# Patient Record
Sex: Female | Born: 1995 | Race: White | Hispanic: No | Marital: Single | Smoking: Current every day smoker
Health system: Southern US, Community
[De-identification: ages and names within clinical notes are randomized; demographics above are authoritative.]

## PROBLEM LIST (undated history)

## (undated) DIAGNOSIS — O24419 Gestational diabetes mellitus in pregnancy, unspecified control: Secondary | ICD-10-CM

## (undated) DIAGNOSIS — K219 Gastro-esophageal reflux disease without esophagitis: Secondary | ICD-10-CM

## (undated) HISTORY — PX: NO PAST SURGERIES: SHX2092

---

## 2017-07-31 ENCOUNTER — Encounter (HOSPITAL_COMMUNITY): Payer: Self-pay

## 2017-07-31 ENCOUNTER — Other Ambulatory Visit: Payer: Self-pay

## 2017-07-31 ENCOUNTER — Emergency Department (HOSPITAL_COMMUNITY)
Admission: EM | Admit: 2017-07-31 | Discharge: 2017-07-31 | Disposition: A | Discharge status: Home / Self Care | Payer: Self-pay | Attending: Emergency Medicine | Admitting: Emergency Medicine

## 2017-07-31 DIAGNOSIS — R1110 Vomiting, unspecified: Secondary | ICD-10-CM | POA: Insufficient documentation

## 2017-07-31 DIAGNOSIS — Z5321 Procedure and treatment not carried out due to patient leaving prior to being seen by health care provider: Secondary | ICD-10-CM | POA: Insufficient documentation

## 2017-07-31 LAB — CBC
HCT: 43.7 % (ref 36.0–46.0)
HEMOGLOBIN: 15.5 g/dL — AB (ref 12.0–15.0)
MCH: 30.9 pg (ref 26.0–34.0)
MCHC: 35.5 g/dL (ref 30.0–36.0)
MCV: 87.1 fL (ref 78.0–100.0)
PLATELETS: 347 10*3/uL (ref 150–400)
RBC: 5.02 MIL/uL (ref 3.87–5.11)
RDW: 12.4 % (ref 11.5–15.5)
WBC: 17.6 10*3/uL — ABNORMAL HIGH (ref 4.0–10.5)

## 2017-07-31 LAB — COMPREHENSIVE METABOLIC PANEL
ALBUMIN: 4.5 g/dL (ref 3.5–5.0)
ALT: 14 U/L (ref 14–54)
AST: 28 U/L (ref 15–41)
Alkaline Phosphatase: 62 U/L (ref 38–126)
Anion gap: 15 (ref 5–15)
BUN: 7 mg/dL (ref 6–20)
CHLORIDE: 103 mmol/L (ref 101–111)
CO2: 19 mmol/L — ABNORMAL LOW (ref 22–32)
CREATININE: 0.85 mg/dL (ref 0.44–1.00)
Calcium: 9.8 mg/dL (ref 8.9–10.3)
GFR calc non Af Amer: >60 mL/min (ref >60)
Glucose, Bld: 120 mg/dL — ABNORMAL HIGH (ref 65–99)
Potassium: 3.3 mmol/L — ABNORMAL LOW (ref 3.5–5.1)
SODIUM: 137 mmol/L (ref 135–145)
Total Bilirubin: 1 mg/dL (ref 0.3–1.2)
Total Protein: 8.2 g/dL — ABNORMAL HIGH (ref 6.5–8.1)

## 2017-07-31 LAB — URINALYSIS, ROUTINE W REFLEX MICROSCOPIC
Bacteria, UA: NONE SEEN
Bilirubin Urine: NEGATIVE
Glucose, UA: NEGATIVE mg/dL
Ketones, ur: NEGATIVE mg/dL
Leukocytes, UA: NEGATIVE
Nitrite: NEGATIVE
PROTEIN: NEGATIVE mg/dL
Specific Gravity, Urine: 1.002 — ABNORMAL LOW (ref 1.005–1.030)
pH: 9 — ABNORMAL HIGH (ref 5.0–8.0)

## 2017-07-31 LAB — I-STAT BETA HCG BLOOD, ED (MC, WL, AP ONLY)

## 2017-07-31 LAB — LIPASE, BLOOD: LIPASE: 34 U/L (ref 11–51)

## 2017-07-31 MED ORDER — ONDANSETRON 4 MG PO TBDP
4.0000 mg | ORAL_TABLET | Freq: Once | ORAL | Status: AC | PRN
  Administered 2017-07-31: 4 mg via ORAL
  Filled 2017-07-31: qty 1

## 2017-07-31 NOTE — ED Triage Notes (Signed)
Per Pt, Pt is coming from home with complaints of vomiting since last night. Now reports some CP and SOB. Denies any diarrhea or abdominal pain.

## 2017-07-31 NOTE — ED Notes (Signed)
No answer x3

## 2017-07-31 NOTE — ED Notes (Signed)
Lab work, radiology results and vital signs reviewed, no critical results at this time, no change in acuity indicated.  

## 2017-07-31 NOTE — ED Notes (Signed)
Called patient to come back to room. No answer.

## 2017-10-18 ENCOUNTER — Emergency Department (HOSPITAL_COMMUNITY)
Admission: EM | Admit: 2017-10-18 | Discharge: 2017-10-18 | Disposition: A | Discharge status: Home / Self Care | Payer: Self-pay | Attending: Emergency Medicine | Admitting: Emergency Medicine

## 2017-10-18 ENCOUNTER — Encounter (HOSPITAL_COMMUNITY): Payer: Self-pay | Admitting: Emergency Medicine

## 2017-10-18 ENCOUNTER — Other Ambulatory Visit: Payer: Self-pay

## 2017-10-18 DIAGNOSIS — R112 Nausea with vomiting, unspecified: Secondary | ICD-10-CM | POA: Insufficient documentation

## 2017-10-18 DIAGNOSIS — F17200 Nicotine dependence, unspecified, uncomplicated: Secondary | ICD-10-CM | POA: Insufficient documentation

## 2017-10-18 LAB — URINALYSIS, ROUTINE W REFLEX MICROSCOPIC
Bilirubin Urine: NEGATIVE
GLUCOSE, UA: NEGATIVE mg/dL
Hgb urine dipstick: NEGATIVE
KETONES UR: NEGATIVE mg/dL
LEUKOCYTES UA: NEGATIVE
NITRITE: NEGATIVE
PH: 8 (ref 5.0–8.0)
Protein, ur: NEGATIVE mg/dL
SPECIFIC GRAVITY, URINE: 1.013 (ref 1.005–1.030)

## 2017-10-18 LAB — CBC
HEMATOCRIT: 44.5 % (ref 36.0–46.0)
HEMOGLOBIN: 15.5 g/dL — AB (ref 12.0–15.0)
MCH: 30 pg (ref 26.0–34.0)
MCHC: 34.8 g/dL (ref 30.0–36.0)
MCV: 86.2 fL (ref 78.0–100.0)
Platelets: 314 10*3/uL (ref 150–400)
RBC: 5.16 MIL/uL — ABNORMAL HIGH (ref 3.87–5.11)
RDW: 12.5 % (ref 11.5–15.5)
WBC: 21.2 10*3/uL — AB (ref 4.0–10.5)

## 2017-10-18 LAB — LIPASE, BLOOD: LIPASE: 32 U/L (ref 11–51)

## 2017-10-18 LAB — COMPREHENSIVE METABOLIC PANEL
ALBUMIN: 4.8 g/dL (ref 3.5–5.0)
ALT: 16 U/L (ref 14–54)
ANION GAP: 14 (ref 5–15)
AST: 23 U/L (ref 15–41)
Alkaline Phosphatase: 68 U/L (ref 38–126)
BUN: 16 mg/dL (ref 6–20)
CHLORIDE: 104 mmol/L (ref 101–111)
CO2: 18 mmol/L — ABNORMAL LOW (ref 22–32)
Calcium: 9.7 mg/dL (ref 8.9–10.3)
Creatinine, Ser: 0.85 mg/dL (ref 0.44–1.00)
GFR calc Af Amer: >60 mL/min (ref >60)
GFR calc non Af Amer: >60 mL/min (ref >60)
GLUCOSE: 121 mg/dL — AB (ref 65–99)
POTASSIUM: 3.6 mmol/L (ref 3.5–5.1)
SODIUM: 136 mmol/L (ref 135–145)
TOTAL PROTEIN: 8.5 g/dL — AB (ref 6.5–8.1)
Total Bilirubin: 0.8 mg/dL (ref 0.3–1.2)

## 2017-10-18 LAB — I-STAT BETA HCG BLOOD, ED (MC, WL, AP ONLY): I-stat hCG, quantitative: <5 m[IU]/mL (ref <5)

## 2017-10-18 MED ORDER — METOCLOPRAMIDE HCL 5 MG/ML IJ SOLN
10.0000 mg | Freq: Once | INTRAMUSCULAR | Status: AC
  Administered 2017-10-18: 10 mg via INTRAVENOUS
  Filled 2017-10-18: qty 2

## 2017-10-18 MED ORDER — METOCLOPRAMIDE HCL 10 MG PO TABS: 10.0000 mg | ORAL_TABLET | Freq: Three times a day (TID) | ORAL | 0 refills | Status: DC | PRN

## 2017-10-18 MED ORDER — PANTOPRAZOLE SODIUM 20 MG PO TBEC: 20.0000 mg | DELAYED_RELEASE_TABLET | Freq: Every day | ORAL | 0 refills | Status: DC

## 2017-10-18 MED ORDER — GI COCKTAIL ~~LOC~~
30.0000 mL | Freq: Once | ORAL | Status: AC
  Administered 2017-10-18: 30 mL via ORAL
  Filled 2017-10-18: qty 30

## 2017-10-18 MED ORDER — ONDANSETRON 4 MG PO TBDP
4.0000 mg | ORAL_TABLET | Freq: Once | ORAL | Status: AC | PRN
  Administered 2017-10-18: 4 mg via ORAL
  Filled 2017-10-18: qty 1

## 2017-10-18 MED ORDER — PANTOPRAZOLE SODIUM 40 MG PO TBEC
40.0000 mg | DELAYED_RELEASE_TABLET | Freq: Once | ORAL | Status: AC
  Administered 2017-10-18: 40 mg via ORAL
  Filled 2017-10-18: qty 1

## 2017-10-18 NOTE — ED Notes (Signed)
ED Provider at bedside. 

## 2017-10-18 NOTE — ED Provider Notes (Signed)
[name] EMERGENCY DEPARTMENT Provider Note   CSN: [id number] Arrival date & time: 10/18/17  1435     History   Chief Complaint Chief Complaint  Patient presents with  . Emesis    HPI Breanna Nguyen is a 22 y.o. female presenting for evaluation of nausea vomiting.  Patient states that when she woke up this morning, she started vomiting.  Every time she lays flat, she starts to vomit again.  She reports improvement with Zofran given while waiting.  She denies fevers, chills, chest pain, shortness of breath, cough, abdominal pain, urinary symptoms, abnormal bowel movements.  She reports multiple sick contacts with "a 24 hr stomach bug."  She has a history of reflux symptoms, but is not on treatment daily.  Ate hamburgers last night.  No recent travel.  No history of abdominal problems or surgeries.  No other medical problems, does not take medications daily.   HPI  History reviewed. No pertinent past medical history.  There are no active problems to display for this patient.   History reviewed. No pertinent surgical history.   OB History   None      Home Medications    Prior to Admission medications   Medication Sig Start Date End Date Taking? Authorizing Provider  metoCLOPramide (REGLAN) 10 MG tablet Take 1 tablet (10 mg total) by mouth every 8 (eight) hours as needed for nausea or vomiting. 10/18/17   Kaori Jumper, PA-C  pantoprazole (PROTONIX) 20 MG tablet Take 1 tablet (20 mg total) by mouth daily. 10/18/17   Lavanya Roa, PA-C    Family History No family history on file.  Social History Social History   Tobacco Use  . Smoking status: Current Some Day Smoker  . Smokeless tobacco: Never Used  Substance Use Topics  . Alcohol use: No    Frequency: Never  . Drug use: No     Allergies   Patient has no known allergies.   Review of Systems Review of Systems  Constitutional: Negative for fever.  Gastrointestinal: Positive for  nausea and vomiting. Negative for abdominal pain, constipation and diarrhea.  All other systems reviewed and are negative.    Physical Exam Updated Vital Signs BP (!) 103/59   Pulse 68   Temp 97.9 F (36.6 C) (Oral)   Resp 18   LMP 10/13/2017   SpO2 100%   Physical Exam  Constitutional: She is oriented to person, place, and time. She appears well-developed and well-nourished. No distress.  Pt resting comfortably in bed in no apparent distress.  HENT:  Head: Normocephalic and atraumatic.  Mouth/Throat: Uvula is midline, oropharynx is clear and moist and mucous membranes are normal. Mucous membranes are not dry.  Eyes: Pupils are equal, round, and reactive to light. Conjunctivae and EOM are normal.  Neck: Normal range of motion. Neck supple.  Cardiovascular: Normal rate, regular rhythm and intact distal pulses.  Pulmonary/Chest: Effort normal and breath sounds normal. No respiratory distress. She has no wheezes.  Abdominal: Soft. Bowel sounds are normal. She exhibits no distension and no mass. There is no tenderness. There is no guarding.  No tenderness palpation of the abdomen.  Soft without rigidity, guarding, or distention.  Bowel sounds normoactive x4.  Musculoskeletal: Normal range of motion.  Neurological: She is alert and oriented to person, place, and time.  Skin: Skin is warm and dry.  Psychiatric: She has a normal mood and affect.  Nursing note and vitals reviewed.    ED Treatments / Results  Labs (all labs ordered are listed, but only abnormal results are displayed) Labs Reviewed  COMPREHENSIVE METABOLIC PANEL - Abnormal; Notable for the following components:      Result Value   CO2 18 (*)    Glucose, Bld 121 (*)    Total Protein 8.5 (*)    All other components within normal limits  CBC - Abnormal; Notable for the following components:   WBC 21.2 (*)    RBC 5.16 (*)    Hemoglobin 15.5 (*)    All other components within normal limits  LIPASE, BLOOD    URINALYSIS, ROUTINE W REFLEX MICROSCOPIC  I-STAT BETA HCG BLOOD, ED ([facility], [facility], [facility] ONLY)    EKG None  Radiology No results found.  Procedures Procedures (including critical care time)  Medications Ordered in ED Medications  ondansetron (ZOFRAN-ODT) disintegrating tablet 4 mg (4 mg Oral Given 10/18/17 1523)  gi cocktail (Maalox,Lidocaine,Donnatal) (30 mLs Oral Given 10/18/17 1643)  pantoprazole (PROTONIX) EC tablet 40 mg (40 mg Oral Given 10/18/17 1643)  metoCLOPramide (REGLAN) injection 10 mg (10 mg Intravenous Given 10/18/17 1815)     Initial Impression / Assessment and Plan / ED Course  I have reviewed the triage vital signs and the nursing notes.  Pertinent labs & imaging results that were available during my care of the patient were reviewed by me and considered in my medical decision making (see chart for details).     Pt presenting for evaluation of nausea and vomiting.  Physical examination, she is afebrile not tachycardic.  She does not appear dehydrated.  She does not appear toxic.  Abdominal exam reassuring without tenderness, rigidity, or guarding.  Vomiting precipitated by lying flat, and patient with history of GERD.  Question gastritis versus GERD.  Will trial GI cocktail and Protonix.  Labs show elevated white count at 21.  Otherwise reassuring, no change in creatinine, kidney, liver, pancreas function.  Does not appear dehydrated.  Discussed findings with patient.  Discussed option of scan versus symptomatic treatment, patient elects to try symptomatic treatment.  Pt reports increased vomiting when taking the medication.  Will give Reglan IV and reassess. Urine without infection.  On reassessment, patient reports nausea is resolved.  No further vomiting.  Tolerating liquids.  Discussed findings with patient.  Discussed treatment with Reglan as needed and Protonix daily.  Follow-up with gi for further evaluation as needed.  At this time, patient present for discharge.   Return precautions given, including signs for appendicitis.  Patient states she understands and agrees to plan.  Final Clinical Impressions(s) / ED Diagnoses   Final diagnoses:  Intractable vomiting with nausea, unspecified vomiting type    ED Discharge Orders        Ordered    metoCLOPramide (REGLAN) 10 MG tablet  Every 8 hours PRN     10/18/17 1928    pantoprazole (PROTONIX) 20 MG tablet  Daily     10/18/17 1928       Alveria ApleyCaccavale, Kysen Wetherington, PA-C 10/18/17 2245    Mancel BaleWentz, Elliott, MD 10/18/17 2250

## 2017-10-18 NOTE — Discharge Instructions (Signed)
Take Protonix once a day for the next 2 weeks. Use Reglan as needed for nausea or vomiting. Be careful with your food choices, try to avoid greasy, fatty, acidic foods.  Eat small, more frequent meals of the day.  Make sure you stay upright for at least 60 minutes after eating. Follow-up with stomach doctor for further evaluation of your symptoms if they persist. Return to the emergency room if you develop high fevers, severe abdominal pain (especially in the right lower abdomen), or any new or concerning symptoms.

## 2017-10-18 NOTE — ED Triage Notes (Signed)
Pt c/o vomiting that started this morning. Denies abdominal pain/diarrhea. LMP 10/13/17

## 2017-10-29 ENCOUNTER — Emergency Department (HOSPITAL_COMMUNITY): Payer: Medicaid Other

## 2017-10-29 ENCOUNTER — Emergency Department (HOSPITAL_COMMUNITY)
Admission: EM | Admit: 2017-10-29 | Discharge: 2017-10-29 | Disposition: A | Discharge status: Home / Self Care | Payer: Medicaid Other | Attending: Emergency Medicine | Admitting: Emergency Medicine

## 2017-10-29 ENCOUNTER — Encounter (HOSPITAL_COMMUNITY): Payer: Self-pay | Admitting: Emergency Medicine

## 2017-10-29 DIAGNOSIS — F17200 Nicotine dependence, unspecified, uncomplicated: Secondary | ICD-10-CM | POA: Insufficient documentation

## 2017-10-29 DIAGNOSIS — K2900 Acute gastritis without bleeding: Secondary | ICD-10-CM | POA: Insufficient documentation

## 2017-10-29 LAB — COMPREHENSIVE METABOLIC PANEL
ALBUMIN: 4.3 g/dL (ref 3.5–5.0)
ALK PHOS: 61 U/L (ref 38–126)
ALT: 13 U/L — ABNORMAL LOW (ref 14–54)
ANION GAP: 12 (ref 5–15)
AST: 19 U/L (ref 15–41)
BUN: 6 mg/dL (ref 6–20)
CALCIUM: 9.6 mg/dL (ref 8.9–10.3)
CO2: 22 mmol/L (ref 22–32)
Chloride: 101 mmol/L (ref 101–111)
Creatinine, Ser: 0.84 mg/dL (ref 0.44–1.00)
GFR calc Af Amer: >60 mL/min (ref >60)
GFR calc non Af Amer: >60 mL/min (ref >60)
GLUCOSE: 108 mg/dL — AB (ref 65–99)
POTASSIUM: 3.5 mmol/L (ref 3.5–5.1)
SODIUM: 135 mmol/L (ref 135–145)
Total Bilirubin: 1.2 mg/dL (ref 0.3–1.2)
Total Protein: 8.1 g/dL (ref 6.5–8.1)

## 2017-10-29 LAB — LIPASE, BLOOD: Lipase: 33 U/L (ref 11–51)

## 2017-10-29 LAB — I-STAT TROPONIN, ED: Troponin i, poc: 0 ng/mL (ref 0.00–0.08)

## 2017-10-29 LAB — CBC
HEMATOCRIT: 42.5 % (ref 36.0–46.0)
HEMOGLOBIN: 14.4 g/dL (ref 12.0–15.0)
MCH: 29.4 pg (ref 26.0–34.0)
MCHC: 33.9 g/dL (ref 30.0–36.0)
MCV: 86.9 fL (ref 78.0–100.0)
Platelets: 350 10*3/uL (ref 150–400)
RBC: 4.89 MIL/uL (ref 3.87–5.11)
RDW: 12.8 % (ref 11.5–15.5)
WBC: 16.8 10*3/uL — ABNORMAL HIGH (ref 4.0–10.5)

## 2017-10-29 LAB — I-STAT BETA HCG BLOOD, ED (MC, WL, AP ONLY): I-stat hCG, quantitative: <5 m[IU]/mL (ref <5)

## 2017-10-29 MED ORDER — RANITIDINE HCL 150 MG PO CAPS: 150.0000 mg | ORAL_CAPSULE | Freq: Every day | ORAL | 0 refills | Status: DC

## 2017-10-29 MED ORDER — GI COCKTAIL ~~LOC~~
30.0000 mL | Freq: Once | ORAL | Status: AC
  Administered 2017-10-29: 30 mL via ORAL
  Filled 2017-10-29: qty 30

## 2017-10-29 MED ORDER — ONDANSETRON 4 MG PO TBDP
4.0000 mg | ORAL_TABLET | Freq: Once | ORAL | Status: AC
  Administered 2017-10-29: 4 mg via ORAL
  Filled 2017-10-29: qty 1

## 2017-10-29 NOTE — ED Provider Notes (Signed)
[facility] EMERGENCY DEPARTMENT Provider Note   CSN: [id number] Arrival date & time: 10/29/17  1350     History   Chief Complaint Chief Complaint  Patient presents with  . Chest Pain    HPI Ann MakiKarissa Dreese is a 22 y.o. female.  HPI   22 year old female presents today with complaints of nausea vomiting.  Patient reports last night after having dinner she had yellow non-bloody emesis.  Patient notes she has been unable to tolerate p.o. since.  She denies any fever, abdominal pain, urinary or bowel changes.  She denies any dark or tarry stools.  Patient also reports last night she developed midline chest pain worse with palpation, worse with movement and inspiration.  Patient notes using Zofran last night but vomited.  Patient was seen in the emergency room for similar symptoms on 10/18/2017 approximately 11 days ago.   History reviewed. No pertinent past medical history.  There are no active problems to display for this patient.   History reviewed. No pertinent surgical history.   OB History   None      Home Medications    Prior to Admission medications   Medication Sig Start Date End Date Taking? Authorizing Provider  metoCLOPramide (REGLAN) 10 MG tablet Take 1 tablet (10 mg total) by mouth every 8 (eight) hours as needed for nausea or vomiting. 10/18/17  Yes Caccavale, Sophia, PA-C  pantoprazole (PROTONIX) 20 MG tablet Take 1 tablet (20 mg total) by mouth daily. 10/18/17  Yes Caccavale, Sophia, PA-C  ranitidine (ZANTAC) 150 MG capsule Take 1 capsule (150 mg total) by mouth daily. 10/29/17   Eyvonne MechanicHedges, Thelton Graca, PA-C    Family History History reviewed. No pertinent family history.  Social History Social History   Tobacco Use  . Smoking status: Current Some Day Smoker  . Smokeless tobacco: Never Used  Substance Use Topics  . Alcohol use: No    Frequency: Never  . Drug use: No     Allergies   Patient has no known allergies.   Review of  Systems Review of Systems  All other systems reviewed and are negative.   Physical Exam Updated Vital Signs BP 121/77   Pulse (!) 52   Temp 98.8 F (37.1 C) (Oral)   Resp 16   LMP 10/13/2017   SpO2 100%   Physical Exam  Constitutional: She is oriented to person, place, and time. She appears well-developed and well-nourished.  HENT:  Head: Normocephalic and atraumatic.  Eyes: Pupils are equal, round, and reactive to light. Conjunctivae are normal. Right eye exhibits no discharge. Left eye exhibits no discharge. No scleral icterus.  Neck: Normal range of motion. No JVD present. No tracheal deviation present.  Cardiovascular: Normal rate, regular rhythm and normal heart sounds. Exam reveals no gallop and no friction rub.  No murmur heard. Pulmonary/Chest: Effort normal. No stridor.  TTP of midline chest - lung expansion normal, clear lung sounds  Abdominal: Soft. She exhibits no distension and no mass. There is no tenderness. There is no rebound and no guarding. No hernia.  Neurological: She is alert and oriented to person, place, and time. Coordination normal.  Psychiatric: She has a normal mood and affect. Her behavior is normal. Judgment and thought content normal.  Nursing note and vitals reviewed.   ED Treatments / Results  Labs (all labs ordered are listed, but only abnormal results are displayed) Labs Reviewed  COMPREHENSIVE METABOLIC PANEL - Abnormal; Notable for the following components:  Result Value   Glucose, Bld 108 (*)    ALT 13 (*)    All other components within normal limits  CBC - Abnormal; Notable for the following components:   WBC 16.8 (*)    All other components within normal limits  LIPASE, BLOOD  URINALYSIS, ROUTINE W REFLEX MICROSCOPIC  I-STAT BETA HCG BLOOD, ED ([facility], [facility], [facility] ONLY)  I-STAT TROPONIN, ED    EKG None  Radiology Dg Chest 2 View  Result Date: 10/29/2017 CLINICAL DATA:  Chest pain with cough and vomiting. EXAM: CHEST - 2 VIEW  COMPARISON:  None. FINDINGS: The heart size and mediastinal contours are within normal limits. Both lungs are clear. The visualized skeletal structures are unremarkable. IMPRESSION: No active cardiopulmonary disease. Electronically Signed   By: Elsie Stain M.D.   On: 10/29/2017 14:54    Procedures Procedures (including critical care time)  Medications Ordered in ED Medications  ondansetron (ZOFRAN-ODT) disintegrating tablet 4 mg (4 mg Oral Given 10/29/17 1811)  gi cocktail (Maalox,Lidocaine,Donnatal) (30 mLs Oral Given 10/29/17 1839)     Initial Impression / Assessment and Plan / ED Course  I have reviewed the triage vital signs and the nursing notes.  Pertinent labs & imaging results that were available during my care of the patient were reviewed by me and considered in my medical decision making (see chart for details).  Clinical Course as of Oct 29 2201  Wed Oct 29, 2017  1528 Comment 3:        [LB]    Clinical Course User Index [LB] Mervin Kung, Student-PA    Labs: Separate hCG, i-STAT troponin, lipase, CMP CBC  Imaging: DG chest 2 view  Consults:  Therapeutics: GI cocktail, Zofran  Discharge Meds:   Assessment/Plan: 22 year old female presents today with likely gastritis.  Patient having chest pain, this was reproducible but resolved completely with GI cocktail.  I have low suspicion for PE, ACS, dissection, or any other life-threatening etiology.  Patient is very well-appearing in no acute distress.  Discharged with Zantac outpatient follow-up and strict return precautions.  Both patient and her mother verbalized understanding and agreement to today's plan.    Final Clinical Impressions(s) / ED Diagnoses   Final diagnoses:  Acute gastritis without hemorrhage, unspecified gastritis type    ED Discharge Orders        Ordered    ranitidine (ZANTAC) 150 MG capsule  Daily     10/29/17 1927       Rosalio Loud 10/29/17 2204    Benjiman Core,  MD 10/31/17 0004

## 2017-10-29 NOTE — ED Triage Notes (Signed)
Patient presents to eD for assessment of 4 episodes of vomiting last night (and through out the night), with developing chest pain today.  Patient states non-productive cough.  Seen 2 weeks ago for the same

## 2017-10-29 NOTE — Discharge Instructions (Addendum)
Please read attached information. If you experience any new or worsening signs or symptoms please return to the emergency room for evaluation. Please follow-up with your primary care provider or specialist as discussed. Please use medication prescribed only as directed and discontinue taking if you have any concerning signs or symptoms.   °

## 2017-12-29 ENCOUNTER — Encounter (HOSPITAL_COMMUNITY): Payer: Self-pay | Admitting: Emergency Medicine

## 2017-12-29 ENCOUNTER — Emergency Department (HOSPITAL_COMMUNITY)
Admission: EM | Admit: 2017-12-29 | Discharge: 2017-12-29 | Disposition: A | Discharge status: Home / Self Care | Payer: Medicaid Other | Attending: Physician Assistant | Admitting: Physician Assistant

## 2017-12-29 DIAGNOSIS — Z79899 Other long term (current) drug therapy: Secondary | ICD-10-CM | POA: Insufficient documentation

## 2017-12-29 DIAGNOSIS — K2970 Gastritis, unspecified, without bleeding: Secondary | ICD-10-CM | POA: Insufficient documentation

## 2017-12-29 DIAGNOSIS — R112 Nausea with vomiting, unspecified: Secondary | ICD-10-CM

## 2017-12-29 DIAGNOSIS — F17200 Nicotine dependence, unspecified, uncomplicated: Secondary | ICD-10-CM | POA: Insufficient documentation

## 2017-12-29 LAB — CBC WITH DIFFERENTIAL/PLATELET
ABS IMMATURE GRANULOCYTES: 0.1 10*3/uL (ref 0.0–0.1)
BASOS ABS: 0 10*3/uL (ref 0.0–0.1)
BASOS PCT: 0 %
Eosinophils Absolute: 0 10*3/uL (ref 0.0–0.7)
Eosinophils Relative: 0 %
HCT: 42.2 % (ref 36.0–46.0)
HEMOGLOBIN: 14 g/dL (ref 12.0–15.0)
Immature Granulocytes: 0 %
LYMPHS PCT: 13 %
Lymphs Abs: 1.7 10*3/uL (ref 0.7–4.0)
MCH: 29.2 pg (ref 26.0–34.0)
MCHC: 33.2 g/dL (ref 30.0–36.0)
MCV: 88.1 fL (ref 78.0–100.0)
Monocytes Absolute: 0.9 10*3/uL (ref 0.1–1.0)
Monocytes Relative: 6 %
NEUTROS ABS: 10.9 10*3/uL — AB (ref 1.7–7.7)
Neutrophils Relative %: 81 %
PLATELETS: 342 10*3/uL (ref 150–400)
RBC: 4.79 MIL/uL (ref 3.87–5.11)
RDW: 12.8 % (ref 11.5–15.5)
WBC: 13.6 10*3/uL — AB (ref 4.0–10.5)

## 2017-12-29 LAB — COMPREHENSIVE METABOLIC PANEL
ALBUMIN: 4.3 g/dL (ref 3.5–5.0)
ALK PHOS: 55 U/L (ref 38–126)
ALT: 12 U/L — AB (ref 14–54)
AST: 19 U/L (ref 15–41)
Anion gap: 12 (ref 5–15)
BUN: 6 mg/dL (ref 6–20)
CALCIUM: 9.5 mg/dL (ref 8.9–10.3)
CHLORIDE: 104 mmol/L (ref 101–111)
CO2: 22 mmol/L (ref 22–32)
Creatinine, Ser: 0.96 mg/dL (ref 0.44–1.00)
GFR calc Af Amer: >60 mL/min (ref >60)
GFR calc non Af Amer: >60 mL/min (ref >60)
GLUCOSE: 109 mg/dL — AB (ref 65–99)
Potassium: 3.9 mmol/L (ref 3.5–5.1)
SODIUM: 138 mmol/L (ref 135–145)
Total Bilirubin: 1.2 mg/dL (ref 0.3–1.2)
Total Protein: 7.8 g/dL (ref 6.5–8.1)

## 2017-12-29 LAB — I-STAT BETA HCG BLOOD, ED (MC, WL, AP ONLY): I-stat hCG, quantitative: <5 m[IU]/mL (ref <5)

## 2017-12-29 MED ORDER — ESOMEPRAZOLE MAGNESIUM 40 MG PO CPDR: 40.0000 mg | DELAYED_RELEASE_CAPSULE | Freq: Every day | ORAL | 0 refills | Status: DC

## 2017-12-29 MED ORDER — FAMOTIDINE IN NACL 20-0.9 MG/50ML-% IV SOLN
20.0000 mg | Freq: Once | INTRAVENOUS | Status: AC
  Administered 2017-12-29: 20 mg via INTRAVENOUS
  Filled 2017-12-29: qty 50

## 2017-12-29 MED ORDER — PROMETHAZINE HCL 25 MG/ML IJ SOLN
12.5000 mg | Freq: Once | INTRAMUSCULAR | Status: AC
  Administered 2017-12-29: 12.5 mg via INTRAVENOUS
  Filled 2017-12-29: qty 1

## 2017-12-29 MED ORDER — SODIUM CHLORIDE 0.9 % IV BOLUS
1000.0000 mL | Freq: Once | INTRAVENOUS | Status: AC
  Administered 2017-12-29: 1000 mL via INTRAVENOUS

## 2017-12-29 MED ORDER — ONDANSETRON HCL 4 MG/2ML IJ SOLN
4.0000 mg | Freq: Once | INTRAMUSCULAR | Status: AC
  Administered 2017-12-29: 4 mg via INTRAVENOUS
  Filled 2017-12-29: qty 2

## 2017-12-29 NOTE — ED Notes (Signed)
Patient reports feeling much better. Given water for PO challenge. MD aware.

## 2017-12-29 NOTE — ED Provider Notes (Signed)
[facility] EMERGENCY DEPARTMENT Provider Note   CSN: [id number] Arrival date & time: 12/29/17  1191     History   Chief Complaint Chief Complaint  Patient presents with  . Abdominal Pain    HPI Breanna Nguyen is a 22 y.o. female.  HPI   Patient is very pleasant 22 year old female presenting with symptoms of epigastric comfort/indigestion as well as vomiting.  She reports last night that she had a little bit of abdominal pain.  She was able to have a normal bowel movement felt better.  However she vomited once.  Then she felt symptoms of indigestion which medication did not help.  She said the Zofran last night helped a little bit with her nausea.  But then it got worse this morning.  Patient had a several times in the past and is here for IV therapy which usually helps her.  Patient reports she is been prescribed pills for heartburn in the past with the been too expensive to afford  She reports she has taken her mom "purple and pink pills".  Which have helped a lot with indigestion.  Patient has no abdominal pain, urinary symptoms, or other complaints.  History reviewed. No pertinent past medical history.  There are no active problems to display for this patient.   History reviewed. No pertinent surgical history.   OB History   None      Home Medications    Prior to Admission medications   Medication Sig Start Date End Date Taking? Authorizing Provider  metoCLOPramide (REGLAN) 10 MG tablet Take 1 tablet (10 mg total) by mouth every 8 (eight) hours as needed for nausea or vomiting. 10/18/17  Yes Caccavale, Sophia, PA-C  ondansetron (ZOFRAN-ODT) 4 MG disintegrating tablet Take 4 mg by mouth every 8 (eight) hours as needed for nausea or vomiting.   Yes [provider]  pantoprazole (PROTONIX) 20 MG tablet Take 1 tablet (20 mg total) by mouth daily. Patient not taking: Reported on 12/29/2017 10/18/17   Caccavale, Sophia, PA-C  ranitidine  (ZANTAC) 150 MG capsule Take 1 capsule (150 mg total) by mouth daily. Patient not taking: Reported on 12/29/2017 10/29/17   Eyvonne Mechanic, PA-C    Family History No family history on file.  Social History Social History   Tobacco Use  . Smoking status: Current Some Day Smoker  . Smokeless tobacco: Never Used  Substance Use Topics  . Alcohol use: No    Frequency: Never  . Drug use: No     Allergies   Patient has no known allergies.   Review of Systems Review of Systems  Constitutional: Negative for activity change.  Respiratory: Negative for shortness of breath.   Cardiovascular: Negative for chest pain.  Gastrointestinal: Positive for nausea and vomiting. Negative for abdominal pain, constipation and diarrhea.  All other systems reviewed and are negative.    Physical Exam Updated Vital Signs BP 122/83   Pulse (!) 55   Temp 98.2 F (36.8 C) (Oral)   Resp 14   Ht 5\' 2"  (1.575 m)   Wt 56.7 kg (125 lb)   LMP 12/08/2017   SpO2 100%   BMI 22.86 kg/m   Physical Exam  Constitutional: She is oriented to person, place, and time. She appears well-developed and well-nourished.  Very well appearing  HENT:  Head: Normocephalic and atraumatic.  Eyes: Right eye exhibits no discharge.  Cardiovascular: Normal rate, regular rhythm and normal heart sounds.  No murmur heard. Pulmonary/Chest: Effort normal and breath  sounds normal. She has no wheezes. She has no rales.  Abdominal: Soft. Normal appearance. She exhibits no distension. There is no tenderness.  Neurological: She is oriented to person, place, and time.  Skin: Skin is warm and dry. She is not diaphoretic.  Psychiatric: She has a normal mood and affect.  Nursing note and vitals reviewed.    ED Treatments / Results  Labs (all labs ordered are listed, but only abnormal results are displayed) Labs Reviewed  CBC WITH DIFFERENTIAL/PLATELET - Abnormal; Notable for the following components:      Result Value   WBC  13.6 (*)    Neutro Abs 10.9 (*)    All other components within normal limits  COMPREHENSIVE METABOLIC PANEL - Abnormal; Notable for the following components:   Glucose, Bld 109 (*)    ALT 12 (*)    All other components within normal limits  I-STAT BETA HCG BLOOD, ED ([facility], [facility], [facility] ONLY)    EKG None  Radiology No results found.  Procedures Procedures (including critical care time)  Medications Ordered in ED Medications  sodium chloride 0.9 % bolus 1,000 mL (1,000 mLs Intravenous New Bag/Given 12/29/17 0811)  ondansetron (ZOFRAN) injection 4 mg (4 mg Intravenous Given 12/29/17 0811)  promethazine (PHENERGAN) injection 12.5 mg (12.5 mg Intravenous Given 12/29/17 0846)  famotidine (PEPCID) IVPB 20 mg premix (0 mg Intravenous Stopped 12/29/17 0926)     Initial Impression / Assessment and Plan / ED Course  I have reviewed the triage vital signs and the nursing notes.  Pertinent labs & imaging results that were available during my care of the patient were reviewed by me and considered in my medical decision making (see chart for details).    Patient is very pleasant 22 year old female presenting with symptoms of epigastric comfort/indigestion as well as vomiting.  She reports last night that she had a little bit of abdominal pain.  She was able to have a normal bowel movement felt better.  However she vomited once.  Then she felt symptoms of indigestion which medication did not help.  She said the Zofran last night helped a little bit with her nausea.  But then it got worse this morning.  Patient had a several times in the past and is here for IV therapy which usually helps her.  Patient reports she is been prescribed pills for heartburn in the past with the been too expensive to afford  She reports she has taken her mom "purple and pink pills".  Which have helped a lot with indigestion.  Patient has no abdominal pain, urinary symptoms, or other complaints.   7:52 AM We will give her  Zofran, fluids, check her labs including pregnancy.  We will plan to give her a prescription for Nexium which I think is the pill that she is referring to.  Gave phenegran, fluids.   10:04 AM Patient now tolerating p.o.  Will give prescription for Nexium as requested.  Final Clinical Impressions(s) / ED Diagnoses   Final diagnoses:  None    ED Discharge Orders    None       Kerrick Miler, Cindee Saltourteney Lyn, MD 12/29/17 1005

## 2017-12-29 NOTE — ED Triage Notes (Signed)
Patient c/o generalized abdominal pain with diarrhea, nausea and vomiting onset of yesterday morning. Pt reports taking zofran with no relief. Denies any urinary symptoms. States she has been seen for gastritis a few times this year. Now having indigestion after emesis.

## 2017-12-29 NOTE — Discharge Instructions (Signed)
You are seen today with feelings of indigestion and vomiting.  We have given you medicine to help with nausea vomiting.  In addition we want you to take Nexium.  We think this is the pill that you are describing that helps your mother. It may be that you to follow-up with gastroenterology because you may have Peptic ulcer disease.  We want you to call gastroenterology, or follow-up with your primary care physician.  To find a primary care or specialty doctor please call [phone] or [phone] to access "[facility] Find a Doctor Service."  You may also go on the [facility] website at [email or web address]  There are also multiple [facility] and [facility] practices throughout the [place] that are frequently accepting new patients. You may find a clinic that is close to your home and contact them.  [facility] -  [address] [place] [phone]  [facility] in [place] (also locations in [place] and [place]) -  [address] [place] [address] [phone]  [facility] -  [address] [place] [phone]

## 2018-05-29 ENCOUNTER — Encounter (HOSPITAL_COMMUNITY): Payer: Self-pay | Admitting: Emergency Medicine

## 2018-05-29 ENCOUNTER — Other Ambulatory Visit: Payer: Self-pay

## 2018-05-29 ENCOUNTER — Emergency Department (HOSPITAL_COMMUNITY)
Admission: EM | Admit: 2018-05-29 | Discharge: 2018-05-29 | Disposition: A | Discharge status: Home / Self Care | Payer: Medicaid Other | Attending: Emergency Medicine | Admitting: Emergency Medicine

## 2018-05-29 DIAGNOSIS — R197 Diarrhea, unspecified: Secondary | ICD-10-CM | POA: Insufficient documentation

## 2018-05-29 DIAGNOSIS — F17210 Nicotine dependence, cigarettes, uncomplicated: Secondary | ICD-10-CM | POA: Insufficient documentation

## 2018-05-29 DIAGNOSIS — Z79899 Other long term (current) drug therapy: Secondary | ICD-10-CM | POA: Insufficient documentation

## 2018-05-29 DIAGNOSIS — R112 Nausea with vomiting, unspecified: Secondary | ICD-10-CM

## 2018-05-29 LAB — CBC WITH DIFFERENTIAL/PLATELET
Abs Immature Granulocytes: 0.06 10*3/uL (ref 0.00–0.07)
BASOS PCT: 0 %
Basophils Absolute: 0.1 10*3/uL (ref 0.0–0.1)
EOS ABS: 0 10*3/uL (ref 0.0–0.5)
EOS PCT: 0 %
HCT: 45 % (ref 36.0–46.0)
Hemoglobin: 14.7 g/dL (ref 12.0–15.0)
IMMATURE GRANULOCYTES: 0 %
LYMPHS ABS: 0.9 10*3/uL (ref 0.7–4.0)
Lymphocytes Relative: 6 %
MCH: 28.5 pg (ref 26.0–34.0)
MCHC: 32.7 g/dL (ref 30.0–36.0)
MCV: 87.2 fL (ref 80.0–100.0)
MONO ABS: 0.2 10*3/uL (ref 0.1–1.0)
MONOS PCT: 1 %
Neutro Abs: 14.2 10*3/uL — ABNORMAL HIGH (ref 1.7–7.7)
Neutrophils Relative %: 93 %
PLATELETS: 340 10*3/uL (ref 150–400)
RBC: 5.16 MIL/uL — ABNORMAL HIGH (ref 3.87–5.11)
RDW: 12.5 % (ref 11.5–15.5)
WBC: 15.4 10*3/uL — ABNORMAL HIGH (ref 4.0–10.5)
nRBC: 0 % (ref 0.0–0.2)

## 2018-05-29 LAB — COMPREHENSIVE METABOLIC PANEL
ALT: 14 U/L (ref 0–44)
AST: 20 U/L (ref 15–41)
Albumin: 4.6 g/dL (ref 3.5–5.0)
Alkaline Phosphatase: 61 U/L (ref 38–126)
Anion gap: 10 (ref 5–15)
BILIRUBIN TOTAL: 0.7 mg/dL (ref 0.3–1.2)
BUN: 9 mg/dL (ref 6–20)
CO2: 21 mmol/L — ABNORMAL LOW (ref 22–32)
Calcium: 10.1 mg/dL (ref 8.9–10.3)
Chloride: 107 mmol/L (ref 98–111)
Creatinine, Ser: 0.89 mg/dL (ref 0.44–1.00)
GFR calc Af Amer: >60 mL/min (ref >60)
Glucose, Bld: 128 mg/dL — ABNORMAL HIGH (ref 70–99)
POTASSIUM: 3.7 mmol/L (ref 3.5–5.1)
Sodium: 138 mmol/L (ref 135–145)
TOTAL PROTEIN: 8.6 g/dL — AB (ref 6.5–8.1)

## 2018-05-29 LAB — URINALYSIS, ROUTINE W REFLEX MICROSCOPIC
Bilirubin Urine: NEGATIVE
GLUCOSE, UA: NEGATIVE mg/dL
Hgb urine dipstick: NEGATIVE
Ketones, ur: 20 mg/dL — AB
LEUKOCYTES UA: NEGATIVE
Nitrite: NEGATIVE
PH: 9 — AB (ref 5.0–8.0)
Protein, ur: 30 mg/dL — AB
SPECIFIC GRAVITY, URINE: 1.021 (ref 1.005–1.030)

## 2018-05-29 LAB — PREGNANCY, URINE: Preg Test, Ur: NEGATIVE

## 2018-05-29 LAB — LIPASE, BLOOD: LIPASE: 35 U/L (ref 11–51)

## 2018-05-29 MED ORDER — SODIUM CHLORIDE 0.9 % IV BOLUS
1000.0000 mL | Freq: Once | INTRAVENOUS | Status: AC
Start: 2018-05-29 — End: 2018-05-29
  Administered 2018-05-29: 1000 mL via INTRAVENOUS

## 2018-05-29 MED ORDER — ONDANSETRON HCL 4 MG/2ML IJ SOLN
4.0000 mg | Freq: Once | INTRAMUSCULAR | Status: AC
  Administered 2018-05-29: 4 mg via INTRAVENOUS
  Filled 2018-05-29: qty 2

## 2018-05-29 MED ORDER — ONDANSETRON 4 MG PO TBDP: 4.0000 mg | ORAL_TABLET | Freq: Three times a day (TID) | ORAL | 0 refills | Status: DC | PRN

## 2018-05-29 MED ORDER — SODIUM CHLORIDE 0.9 % IV BOLUS
1000.0000 mL | Freq: Once | INTRAVENOUS | Status: AC
  Administered 2018-05-29: 1000 mL via INTRAVENOUS

## 2018-05-29 NOTE — ED Triage Notes (Signed)
Pt presents to ED with complaints of vomiting since 5am this morning. Pt states she has vomited x20 and diarrhea as well.

## 2018-05-29 NOTE — Discharge Instructions (Signed)
Follow up with your primary care doctor to discuss your hospital visit. Continue to hydrate orally with small sips of fluids throughout the day. Use Zofran as directed for nausea & vomiting.   The 'BRAT' diet is suggested, then progress to diet as tolerated as symptoms abate.  Bananas.  Rice.  Applesauce.  Toast (and other simple starches such as crackers, potatoes, noodles).   SEEK IMMEDIATE MEDICAL ATTENTION IF: You begin having localized abdominal pain that does not go away or becomes severe A temperature above 101 develops Repeated vomiting occurs (multiple uncontrollable episodes) or you are unable to keep fluids down Blood is being passed in stools or vomit (bright red or black tarry stools).  New or worsening symptoms develop, you have any additional concerns.

## 2018-05-29 NOTE — ED Notes (Signed)
Patient verbalizes understanding of discharge instructions. Opportunity for questioning and answers were provided. Armband removed by staff, pt discharged from ED ambulatory to home.  

## 2018-05-29 NOTE — ED Provider Notes (Signed)
[facility] EMERGENCY DEPARTMENT Provider Note   CSN: [id number] Arrival date & time: 05/29/18  1154     History   Chief Complaint Chief Complaint  Patient presents with  . Abdominal Pain    HPI Breanna Nguyen is a 22 y.o. female.  The history is provided by the patient and medical records. No language interpreter was used.   Breanna Nguyen is an otherwise healthy 22 year old female who presents to the emergency department for nausea and vomiting which woke her up from her sleep this morning around 5 AM.  She reports 10+ episodes of emesis.  She also has had 2 loose stools.  Denies any abdominal pain, just feels queasy.  Denies any urinary symptoms, vaginal discharge, back pain, chest pain or shortness of breath.  No medications taken tract prior to arrival for her symptoms.  No fever or chills.  Denies any known sick contacts.  No recent travel or suspicious food intake.  No camping or well water intake.  History reviewed. No pertinent past medical history.  There are no active problems to display for this patient.   History reviewed. No pertinent surgical history.   OB History   None      Home Medications    Prior to Admission medications   Medication Sig Start Date End Date Taking? Authorizing Provider  esomeprazole (NEXIUM) 40 MG capsule Take 1 capsule (40 mg total) by mouth daily. 12/29/17   Mackuen, Courteney Lyn, MD  metoCLOPramide (REGLAN) 10 MG tablet Take 1 tablet (10 mg total) by mouth every 8 (eight) hours as needed for nausea or vomiting. 10/18/17   Caccavale, Sophia, PA-C  ondansetron (ZOFRAN-ODT) 4 MG disintegrating tablet Take 4 mg by mouth every 8 (eight) hours as needed for nausea or vomiting.    [provider]  pantoprazole (PROTONIX) 20 MG tablet Take 1 tablet (20 mg total) by mouth daily. Patient not taking: Reported on 12/29/2017 10/18/17   Caccavale, Sophia, PA-C  ranitidine (ZANTAC) 150 MG capsule Take 1 capsule (150 mg  total) by mouth daily. Patient not taking: Reported on 12/29/2017 10/29/17   Eyvonne Mechanic, PA-C    Family History No family history on file.  Social History Social History   Tobacco Use  . Smoking status: Current Some Day Smoker  . Smokeless tobacco: Never Used  Substance Use Topics  . Alcohol use: No    Frequency: Never  . Drug use: No     Allergies   Patient has no known allergies.   Review of Systems Review of Systems  Gastrointestinal: Positive for diarrhea, nausea and vomiting. Negative for abdominal pain and blood in stool.  All other systems reviewed and are negative.    Physical Exam Updated Vital Signs BP 118/70 (BP Location: Right Arm)   Pulse 66   Temp 97.9 F (36.6 C)   Ht 5\' 3"  (1.6 m)   Wt 65.8 kg   SpO2 99%   BMI 25.69 kg/m   Physical Exam  Constitutional: She is oriented to person, place, and time. She appears well-developed and well-nourished. No distress.  Nontoxic-appearing.  HENT:  Head: Normocephalic and atraumatic.  Neck: Neck supple.  Cardiovascular: Normal rate, regular rhythm and normal heart sounds.  No murmur heard. Pulmonary/Chest: Effort normal and breath sounds normal. No respiratory distress.  Abdominal: Soft. Bowel sounds are normal. She exhibits no distension.  No abdominal tenderness.  No CVA tenderness.  Neurological: She is alert and oriented to person, place, and time.  Skin: Skin  is warm and dry.  Nursing note and vitals reviewed.    ED Treatments / Results  Labs (all labs ordered are listed, but only abnormal results are displayed) Labs Reviewed  CBC WITH DIFFERENTIAL/PLATELET - Abnormal; Notable for the following components:      Result Value   WBC 15.4 (*)    RBC 5.16 (*)    Neutro Abs 14.2 (*)    All other components within normal limits  COMPREHENSIVE METABOLIC PANEL - Abnormal; Notable for the following components:   CO2 21 (*)    Glucose, Bld 128 (*)    Total Protein 8.6 (*)    All other components  within normal limits  URINALYSIS, ROUTINE W REFLEX MICROSCOPIC - Abnormal; Notable for the following components:   APPearance HAZY (*)    pH 9.0 (*)    Ketones, ur 20 (*)    Protein, ur 30 (*)    Bacteria, UA FEW (*)    All other components within normal limits  LIPASE, BLOOD  PREGNANCY, URINE    EKG None  Radiology No results found.  Procedures Procedures (including critical care time)  Medications Ordered in ED Medications  sodium chloride 0.9 % bolus 1,000 mL (1,000 mLs Intravenous New Bag/Given 05/29/18 1354)  sodium chloride 0.9 % bolus 1,000 mL (0 mLs Intravenous Stopped 05/29/18 1348)  ondansetron (ZOFRAN) injection 4 mg (4 mg Intravenous Given 05/29/18 1221)     Initial Impression / Assessment and Plan / ED Course  I have reviewed the triage vital signs and the nursing notes.  Pertinent labs & imaging results that were available during my care of the patient were reviewed by me and considered in my medical decision making (see chart for details).    Breanna Nguyen is a 22 y.o. female who presents to ED for nausea, vomiting and non-bloody diarrhea which began today. On exam, patient is afebrile, non-toxic appearing with a non-surgical abdominal exam. IV fluids and nausea medications given. Labs reviewed and reassuring. On repeat examination, patient is now tolerating PO with no episodes of emesis since medication administration. Repeat abdominal exam benign. Sxs c/w viral etiology. Evaluation does not show pathology that would require ongoing emergent intervention or inpatient treatment. Rx for zofran given. PCP follow up encouraged. Spoke at length with patient about signs or symptoms that should prompt return to emergency Department including inability to tolerate PO, blood in the stools, fevers, focal localization of abdominal pain, new/worsening symptoms or any additional concerns. Patient understands diagnosis and plan of care as dictated above. All questions  answered.   Final Clinical Impressions(s) / ED Diagnoses   Final diagnoses:  Nausea vomiting and diarrhea    ED Discharge Orders    None       Lillyanna Glandon, Chase PicketJaime Pilcher, PA-C 05/29/18 1401    Gwyneth SproutPlunkett, Whitney, MD 05/29/18 1845

## 2018-07-30 ENCOUNTER — Other Ambulatory Visit: Payer: Self-pay

## 2018-07-30 ENCOUNTER — Emergency Department (HOSPITAL_COMMUNITY)
Admission: EM | Admit: 2018-07-30 | Discharge: 2018-07-30 | Disposition: A | Discharge status: Home / Self Care | Payer: Medicaid Other | Attending: Emergency Medicine | Admitting: Emergency Medicine

## 2018-07-30 ENCOUNTER — Encounter (HOSPITAL_COMMUNITY): Payer: Self-pay | Admitting: Emergency Medicine

## 2018-07-30 DIAGNOSIS — F17200 Nicotine dependence, unspecified, uncomplicated: Secondary | ICD-10-CM | POA: Insufficient documentation

## 2018-07-30 DIAGNOSIS — Z79899 Other long term (current) drug therapy: Secondary | ICD-10-CM | POA: Insufficient documentation

## 2018-07-30 DIAGNOSIS — E876 Hypokalemia: Secondary | ICD-10-CM | POA: Insufficient documentation

## 2018-07-30 DIAGNOSIS — K529 Noninfective gastroenteritis and colitis, unspecified: Secondary | ICD-10-CM

## 2018-07-30 LAB — COMPREHENSIVE METABOLIC PANEL
ALBUMIN: 4.6 g/dL (ref 3.5–5.0)
ALT: 11 U/L (ref 0–44)
AST: 18 U/L (ref 15–41)
Alkaline Phosphatase: 56 U/L (ref 38–126)
Anion gap: 12 (ref 5–15)
BUN: 5 mg/dL — ABNORMAL LOW (ref 6–20)
CO2: 21 mmol/L — ABNORMAL LOW (ref 22–32)
Calcium: 9.4 mg/dL (ref 8.9–10.3)
Chloride: 103 mmol/L (ref 98–111)
Creatinine, Ser: 0.86 mg/dL (ref 0.44–1.00)
GFR calc Af Amer: >60 mL/min (ref >60)
GFR calc non Af Amer: >60 mL/min (ref >60)
Glucose, Bld: 134 mg/dL — ABNORMAL HIGH (ref 70–99)
POTASSIUM: 2.6 mmol/L — AB (ref 3.5–5.1)
Sodium: 136 mmol/L (ref 135–145)
Total Bilirubin: 1.3 mg/dL — ABNORMAL HIGH (ref 0.3–1.2)
Total Protein: 8.1 g/dL (ref 6.5–8.1)

## 2018-07-30 LAB — CBC
HCT: 42.1 % (ref 36.0–46.0)
HEMOGLOBIN: 13.6 g/dL (ref 12.0–15.0)
MCH: 28.3 pg (ref 26.0–34.0)
MCHC: 32.3 g/dL (ref 30.0–36.0)
MCV: 87.7 fL (ref 80.0–100.0)
Platelets: 347 10*3/uL (ref 150–400)
RBC: 4.8 MIL/uL (ref 3.87–5.11)
RDW: 12.7 % (ref 11.5–15.5)
WBC: 16.8 10*3/uL — ABNORMAL HIGH (ref 4.0–10.5)
nRBC: 0 % (ref 0.0–0.2)

## 2018-07-30 LAB — URINALYSIS, ROUTINE W REFLEX MICROSCOPIC
Bacteria, UA: NONE SEEN
Bilirubin Urine: NEGATIVE
Glucose, UA: NEGATIVE mg/dL
Ketones, ur: 5 mg/dL — AB
Leukocytes, UA: NEGATIVE
Nitrite: NEGATIVE
Protein, ur: NEGATIVE mg/dL
Specific Gravity, Urine: 1.01 (ref 1.005–1.030)
pH: 6 (ref 5.0–8.0)

## 2018-07-30 LAB — LIPASE, BLOOD: Lipase: 41 U/L (ref 11–51)

## 2018-07-30 LAB — I-STAT BETA HCG BLOOD, ED (MC, WL, AP ONLY): I-stat hCG, quantitative: <5 m[IU]/mL (ref <5)

## 2018-07-30 MED ORDER — ONDANSETRON HCL 4 MG/2ML IJ SOLN
4.0000 mg | Freq: Once | INTRAMUSCULAR | Status: AC
  Administered 2018-07-30: 4 mg via INTRAVENOUS
  Filled 2018-07-30: qty 2

## 2018-07-30 MED ORDER — ONDANSETRON HCL 4 MG PO TABS: 4.0000 mg | ORAL_TABLET | Freq: Four times a day (QID) | ORAL | 0 refills | Status: DC

## 2018-07-30 MED ORDER — POTASSIUM CHLORIDE ER 20 MEQ PO TBCR: 20.0000 meq | EXTENDED_RELEASE_TABLET | Freq: Every day | ORAL | 0 refills | Status: DC

## 2018-07-30 MED ORDER — POTASSIUM CHLORIDE 20 MEQ/15ML (10%) PO SOLN: 20.0000 meq | Freq: Every day | ORAL | 0 refills | Status: DC

## 2018-07-30 MED ORDER — POTASSIUM CHLORIDE CRYS ER 20 MEQ PO TBCR
40.0000 meq | EXTENDED_RELEASE_TABLET | Freq: Once | ORAL | Status: DC
  Filled 2018-07-30: qty 2

## 2018-07-30 MED ORDER — POTASSIUM CHLORIDE 20 MEQ/15ML (10%) PO SOLN
40.0000 meq | Freq: Once | ORAL | Status: AC
  Administered 2018-07-30: 40 meq via ORAL
  Filled 2018-07-30: qty 30

## 2018-07-30 MED ORDER — POTASSIUM CHLORIDE 10 MEQ/100ML IV SOLN
10.0000 meq | Freq: Once | INTRAVENOUS | Status: AC
  Administered 2018-07-30: 10 meq via INTRAVENOUS
  Filled 2018-07-30: qty 100

## 2018-07-30 MED ORDER — SODIUM CHLORIDE 0.9% FLUSH
3.0000 mL | Freq: Once | INTRAVENOUS | Status: AC
  Administered 2018-07-30: 3 mL via INTRAVENOUS

## 2018-07-30 NOTE — ED Provider Notes (Signed)
[facility] EMERGENCY DEPARTMENT Provider Note   CSN: [id number] Arrival date & time: 07/30/18  1127     History   Chief Complaint Chief Complaint  Patient presents with  . Emesis  . Gastroesophageal Reflux    HPI Breanna Nguyen is a 23 y.o. female.  HPI   23 year old female presents today with complaints of nausea vomiting diarrhea.  Patient notes symptoms started approximately 4 days ago with epigastric discomfort, nausea and vomiting.  She denies any significant pain at rest, denies any fever.  She notes attempting antacids at home without significant improvement in her symptoms.  She denies any lower abdominal pain dysuria, vaginal bleeding or discharge.  She does note some nonbloody diarrhea over the last 2 days.  Patient notes she has been able to tolerate liquids but has been unable to eat.  She notes numerous close sick contacts with similar symptoms.  History reviewed. No pertinent past medical history.  There are no active problems to display for this patient.   History reviewed. No pertinent surgical history.   OB History   No obstetric history on file.      Home Medications    Prior to Admission medications   Medication Sig Start Date End Date Taking? Authorizing Provider  esomeprazole (NEXIUM) 40 MG capsule Take 1 capsule (40 mg total) by mouth daily. 12/29/17   Mackuen, Courteney Lyn, MD  metoCLOPramide (REGLAN) 10 MG tablet Take 1 tablet (10 mg total) by mouth every 8 (eight) hours as needed for nausea or vomiting. 10/18/17   Caccavale, Sophia, PA-C  ondansetron (ZOFRAN ODT) 4 MG disintegrating tablet Take 1 tablet (4 mg total) by mouth every 8 (eight) hours as needed for nausea or vomiting. 05/29/18   Ward, Chase Picket, PA-C  ondansetron (ZOFRAN) 4 MG tablet Take 1 tablet (4 mg total) by mouth every 6 (six) hours. 07/30/18   Johnte Portnoy, Tinnie Gens, PA-C  pantoprazole (PROTONIX) 20 MG tablet Take 1 tablet (20 mg total) by mouth daily. Patient  not taking: Reported on 12/29/2017 10/18/17   Caccavale, Sophia, PA-C  potassium chloride 20 MEQ TBCR Take 20 mEq by mouth daily. 07/30/18   Alfrieda Tarry, Tinnie Gens, PA-C  ranitidine (ZANTAC) 150 MG capsule Take 1 capsule (150 mg total) by mouth daily. Patient not taking: Reported on 12/29/2017 10/29/17   Eyvonne Mechanic, PA-C    Family History No family history on file.  Social History Social History   Tobacco Use  . Smoking status: Current Some Day Smoker  . Smokeless tobacco: Never Used  Substance Use Topics  . Alcohol use: No    Frequency: Never  . Drug use: No     Allergies   Patient has no known allergies.   Review of Systems Review of Systems  All other systems reviewed and are negative.    Physical Exam Updated Vital Signs BP 114/78 (BP Location: Right Arm)   Pulse (!) 52   Temp 98.5 F (36.9 C) (Oral)   Resp 16   SpO2 100%   Physical Exam Vitals signs and nursing note reviewed.  Constitutional:      Appearance: She is well-developed.  HENT:     Head: Normocephalic and atraumatic.  Eyes:     General: No scleral icterus.       Right eye: No discharge.        Left eye: No discharge.     Conjunctiva/sclera: Conjunctivae normal.     Pupils: Pupils are equal, round, and reactive to light.  Neck:  Musculoskeletal: Normal range of motion.     Vascular: No JVD.     Trachea: No tracheal deviation.  Pulmonary:     Effort: Pulmonary effort is normal.     Breath sounds: No stridor.  Abdominal:     Comments: Minimal epigastric tenderness to palpation remainder abdomen soft nontender, no upper quadrant tenderness palpation  Neurological:     Mental Status: She is alert and oriented to person, place, and time.     Coordination: Coordination normal.  Psychiatric:        Behavior: Behavior normal.        Thought Content: Thought content normal.        Judgment: Judgment normal.      ED Treatments / Results  Labs (all labs ordered are listed, but only abnormal  results are displayed) Labs Reviewed  COMPREHENSIVE METABOLIC PANEL - Abnormal; Notable for the following components:      Result Value   Potassium 2.6 (*)    CO2 21 (*)    Glucose, Bld 134 (*)    BUN 5 (*)    Total Bilirubin 1.3 (*)    All other components within normal limits  CBC - Abnormal; Notable for the following components:   WBC 16.8 (*)    All other components within normal limits  URINALYSIS, ROUTINE W REFLEX MICROSCOPIC - Abnormal; Notable for the following components:   Hgb urine dipstick SMALL (*)    Ketones, ur 5 (*)    All other components within normal limits  LIPASE, BLOOD  I-STAT BETA HCG BLOOD, ED ([facility], [facility], [facility] ONLY)    EKG EKG Interpretation  Date/Time:  Thursday July 30 2018 14:27:18 EST Ventricular Rate:  46 PR Interval:  160 QRS Duration: 82 QT Interval:  464 QTC Calculation: 406 R Axis:   61 Text Interpretation:  Sinus bradycardia Otherwise normal ECG No significant change since last tracing Confirmed by Jacalyn Lefevre [phone]) on 07/30/2018 2:32:03 PM   Radiology No results found.  Procedures Procedures (including critical care time)  Medications Ordered in ED Medications  potassium chloride SA (K-DUR,KLOR-CON) CR tablet 40 mEq (has no administration in time range)  sodium chloride flush (NS) 0.9 % injection 3 mL (3 mLs Intravenous Given 07/30/18 1315)  ondansetron (ZOFRAN) injection 4 mg (4 mg Intravenous Given 07/30/18 1314)  potassium chloride 10 mEq in 100 mL IVPB (10 mEq Intravenous New Bag/Given 07/30/18 1350)     Initial Impression / Assessment and Plan / ED Course  I have reviewed the triage vital signs and the nursing notes.  Pertinent labs & imaging results that were available during my care of the patient were reviewed by me and considered in my medical decision making (see chart for details).      Potassium 2.6 , WBC 16.8  Assessment/Plan: 23 year old female presents today with likely viral gastroenteritis.  No signs of  liver or gallbladder pathology at this time.  She is well appearing in no acute distress, tolerating p.o.  She has hyperkalemia here with no significant EKG changes.  Patient discharged with antinausea medication potassium and outpatient follow-up with gastroenterology due to recurrence of symptoms.  She verbalized understanding and agreement to today's plan.  She was given strict return precautions.      Final Clinical Impressions(s) / ED Diagnoses   Final diagnoses:  Hypokalemia  Gastroenteritis    ED Discharge Orders         Ordered    ondansetron (ZOFRAN) 4 MG tablet  Every 6 hours  07/30/18 1449    potassium chloride 20 MEQ TBCR  Daily     07/30/18 1449           Rosalio LoudHedges, Ariez Neilan, PA-C 07/30/18 1455    Jacalyn LefevreHaviland, Julie, MD 07/31/18 1109

## 2018-07-30 NOTE — ED Triage Notes (Signed)
Pt with vomiting since Monday, reports she thought it was her GERD, attempted taking antacid but could not keep them down. Denies fevers but had diarrhea for 2 days.

## 2018-07-30 NOTE — Discharge Instructions (Addendum)
Please read attached information. If you experience any new or worsening signs or symptoms please return to the emergency room for evaluation. Please follow-up with your primary care provider or specialist as discussed. Please use medication prescribed only as directed and discontinue taking if you have any concerning signs or symptoms.  Please follow-up with your primary care provider in 1 week for repeat of potassium.

## 2020-02-09 DIAGNOSIS — Z3041 Encounter for surveillance of contraceptive pills: Secondary | ICD-10-CM | POA: Diagnosis not present

## 2020-02-09 DIAGNOSIS — Z3200 Encounter for pregnancy test, result unknown: Secondary | ICD-10-CM | POA: Diagnosis not present

## 2020-02-23 ENCOUNTER — Encounter (HOSPITAL_COMMUNITY): Payer: Self-pay | Admitting: Emergency Medicine

## 2020-02-23 ENCOUNTER — Other Ambulatory Visit: Payer: Self-pay

## 2020-02-23 ENCOUNTER — Emergency Department (HOSPITAL_COMMUNITY)
Admission: EM | Admit: 2020-02-23 | Discharge: 2020-02-23 | Disposition: A | Discharge status: Home / Self Care | Payer: Medicaid Other | Attending: Emergency Medicine | Admitting: Emergency Medicine

## 2020-02-23 DIAGNOSIS — R109 Unspecified abdominal pain: Secondary | ICD-10-CM | POA: Diagnosis not present

## 2020-02-23 DIAGNOSIS — Z5321 Procedure and treatment not carried out due to patient leaving prior to being seen by health care provider: Secondary | ICD-10-CM | POA: Insufficient documentation

## 2020-02-23 DIAGNOSIS — Z3A01 Less than 8 weeks gestation of pregnancy: Secondary | ICD-10-CM | POA: Diagnosis not present

## 2020-02-23 DIAGNOSIS — R0602 Shortness of breath: Secondary | ICD-10-CM | POA: Insufficient documentation

## 2020-02-23 DIAGNOSIS — O26891 Other specified pregnancy related conditions, first trimester: Secondary | ICD-10-CM | POA: Insufficient documentation

## 2020-02-23 DIAGNOSIS — Z20822 Contact with and (suspected) exposure to covid-19: Secondary | ICD-10-CM | POA: Diagnosis not present

## 2020-02-23 DIAGNOSIS — R079 Chest pain, unspecified: Secondary | ICD-10-CM | POA: Diagnosis not present

## 2020-02-23 DIAGNOSIS — O219 Vomiting of pregnancy, unspecified: Secondary | ICD-10-CM | POA: Insufficient documentation

## 2020-02-23 LAB — COMPREHENSIVE METABOLIC PANEL
ALT: 21 U/L (ref 0–44)
AST: 19 U/L (ref 15–41)
Albumin: 4.4 g/dL (ref 3.5–5.0)
Alkaline Phosphatase: 57 U/L (ref 38–126)
Anion gap: 15 (ref 5–15)
BUN: 7 mg/dL (ref 6–20)
CO2: 18 mmol/L — ABNORMAL LOW (ref 22–32)
Calcium: 9.8 mg/dL (ref 8.9–10.3)
Chloride: 103 mmol/L (ref 98–111)
Creatinine, Ser: 0.65 mg/dL (ref 0.44–1.00)
GFR calc Af Amer: >60 mL/min (ref >60)
GFR calc non Af Amer: >60 mL/min (ref >60)
Glucose, Bld: 94 mg/dL (ref 70–99)
Potassium: 3.3 mmol/L — ABNORMAL LOW (ref 3.5–5.1)
Sodium: 136 mmol/L (ref 135–145)
Total Bilirubin: 1.4 mg/dL — ABNORMAL HIGH (ref 0.3–1.2)
Total Protein: 8 g/dL (ref 6.5–8.1)

## 2020-02-23 LAB — CBC
HCT: 40.8 % (ref 36.0–46.0)
Hemoglobin: 13.8 g/dL (ref 12.0–15.0)
MCH: 29.5 pg (ref 26.0–34.0)
MCHC: 33.8 g/dL (ref 30.0–36.0)
MCV: 87.2 fL (ref 80.0–100.0)
Platelets: 405 10*3/uL — ABNORMAL HIGH (ref 150–400)
RBC: 4.68 MIL/uL (ref 3.87–5.11)
RDW: 13.2 % (ref 11.5–15.5)
WBC: 19.2 10*3/uL — ABNORMAL HIGH (ref 4.0–10.5)
nRBC: 0 % (ref 0.0–0.2)

## 2020-02-23 LAB — URINALYSIS, ROUTINE W REFLEX MICROSCOPIC
Bilirubin Urine: NEGATIVE
Glucose, UA: NEGATIVE mg/dL
Hgb urine dipstick: NEGATIVE
Ketones, ur: 80 mg/dL — AB
Leukocytes,Ua: NEGATIVE
Nitrite: NEGATIVE
Protein, ur: 30 mg/dL — AB
Specific Gravity, Urine: 1.023 (ref 1.005–1.030)
pH: 6 (ref 5.0–8.0)

## 2020-02-23 LAB — I-STAT BETA HCG BLOOD, ED (MC, WL, AP ONLY): I-stat hCG, quantitative: >2000 m[IU]/mL — ABNORMAL HIGH (ref <5)

## 2020-02-23 LAB — SARS CORONAVIRUS 2 BY RT PCR (HOSPITAL ORDER, PERFORMED IN ~~LOC~~ HOSPITAL LAB): SARS Coronavirus 2: NEGATIVE

## 2020-02-23 LAB — LIPASE, BLOOD: Lipase: 31 U/L (ref 11–51)

## 2020-02-23 NOTE — ED Triage Notes (Addendum)
Pt feeling poorly all day yesterday, then began vomiting last night. Has been throwing up all day. Endorses chest pain, abdominal pain, and shortness of breath. [redacted] weeks pregnant.

## 2020-03-09 DIAGNOSIS — Z113 Encounter for screening for infections with a predominantly sexual mode of transmission: Secondary | ICD-10-CM | POA: Diagnosis not present

## 2020-03-09 DIAGNOSIS — Z124 Encounter for screening for malignant neoplasm of cervix: Secondary | ICD-10-CM | POA: Diagnosis not present

## 2020-03-09 DIAGNOSIS — Z3481 Encounter for supervision of other normal pregnancy, first trimester: Secondary | ICD-10-CM | POA: Diagnosis not present

## 2020-03-09 DIAGNOSIS — Z3480 Encounter for supervision of other normal pregnancy, unspecified trimester: Secondary | ICD-10-CM | POA: Diagnosis not present

## 2020-03-09 DIAGNOSIS — O26849 Uterine size-date discrepancy, unspecified trimester: Secondary | ICD-10-CM | POA: Diagnosis not present

## 2020-03-09 LAB — OB RESULTS CONSOLE HIV ANTIBODY (ROUTINE TESTING): HIV: NONREACTIVE

## 2020-03-09 LAB — OB RESULTS CONSOLE GC/CHLAMYDIA
Chlamydia: NEGATIVE
Gonorrhea: NEGATIVE

## 2020-03-09 LAB — HEPATITIS C ANTIBODY: HCV Ab: NEGATIVE

## 2020-03-09 LAB — OB RESULTS CONSOLE RPR: RPR: NONREACTIVE

## 2020-03-09 LAB — OB RESULTS CONSOLE HEPATITIS B SURFACE ANTIGEN: Hepatitis B Surface Ag: NEGATIVE

## 2020-03-09 LAB — OB RESULTS CONSOLE RUBELLA ANTIBODY, IGM: Rubella: IMMUNE

## 2020-04-07 DIAGNOSIS — N390 Urinary tract infection, site not specified: Secondary | ICD-10-CM | POA: Diagnosis not present

## 2020-05-22 DIAGNOSIS — Z363 Encounter for antenatal screening for malformations: Secondary | ICD-10-CM | POA: Diagnosis not present

## 2020-07-08 NOTE — L&D Delivery Note (Signed)
Delivery Note Breanna Nguyen is a G1P0 at [redacted]w[redacted]d who had a spontaneous delivery at 2032  a viable female was delivered via ROA. APGAR: 8 , 9 ; weight 7lb11.6oz (3504g)  .    Admitted for IOL for A2GDM. Induced with pitocin and AROM. Progressed normally. Received epidural for pain management. Pushed for 60 minutes. Nuchal cord was noted and reduced after delivery of fetal head. Delivery complicated by shoulder dystocia which was relieved in less than 20 seconds with McRoberts and suprapubic pressure. Infant placed onto maternal abdomen.   Placenta delivered spontaneously. Bilateral periurethral lacerations repaired with 3-0 vicryl in an interrupted fashion. First degree perineal laceration repaired with 2-0 vicryl in continuous fashion.   Bimanual exam was performed to express clot and quarter size cotyledon was expressed. This prompted manual exploration of the uterine cavity and a small piece of placenta membrane was found and removed. Bedside transabdominal US was performed and no retained placental products noted. The uterine cavity was explored manually once more and no additional products noted. Excellent uterine tone and hemostasis appreciated.   Estimated blood loss 200cc. Instrument and gauze counts were correct at the end of the procedure.  Placenta status: to L&D  Anesthesia:  epidural Episiotomy:  none Lacerations:  bilateral periurethral lacerations and first degree perineal laceration Suture Repair: 2.0 and 3.0 vicryl Est. Blood Loss (mL):   Mom to postpartum.  Baby to Couplet care / Skin to Skin.  Charlett Nose 10/03/2020, 9:18 PM

## 2020-07-18 DIAGNOSIS — Z3480 Encounter for supervision of other normal pregnancy, unspecified trimester: Secondary | ICD-10-CM | POA: Diagnosis not present

## 2020-08-01 DIAGNOSIS — O99810 Abnormal glucose complicating pregnancy: Secondary | ICD-10-CM | POA: Diagnosis not present

## 2020-08-03 DIAGNOSIS — Z23 Encounter for immunization: Secondary | ICD-10-CM | POA: Diagnosis not present

## 2020-08-04 ENCOUNTER — Encounter: Payer: Medicaid Other | Attending: Obstetrics and Gynecology | Admitting: Registered"

## 2020-08-04 ENCOUNTER — Other Ambulatory Visit: Payer: Self-pay

## 2020-08-04 ENCOUNTER — Encounter: Payer: Self-pay | Admitting: Registered"

## 2020-08-04 DIAGNOSIS — O24419 Gestational diabetes mellitus in pregnancy, unspecified control: Secondary | ICD-10-CM | POA: Insufficient documentation

## 2020-08-04 NOTE — Progress Notes (Signed)
Patient was seen on 1//28/22 for Gestational Diabetes self-management class at [facility]. The following learning objectives were met by the patient during this course:   States the definition of Gestational Diabetes  States why dietary management is important in controlling blood glucose  Describes the effects each nutrient has on blood glucose levels  Demonstrates ability to create a balanced meal plan  Demonstrates carbohydrate counting   States when to check blood glucose levels  Demonstrates proper blood glucose monitoring techniques  States the effect of stress and exercise on blood glucose levels  States the importance of limiting caffeine and abstaining from alcohol and smoking  Blood glucose monitor given: Accu-chek Guide Me Lot #[id number] Exp: 08/27/2021 CBG: 82 mg/dL  Patient instructed to monitor glucose levels: FBS: 60 - <95; 1 hour: <140; 2 hour: <120  Patient received handouts:  Nutrition Diabetes and Pregnancy, including carb counting list  Patient will be seen for follow-up as needed.

## 2020-08-23 DIAGNOSIS — O24419 Gestational diabetes mellitus in pregnancy, unspecified control: Secondary | ICD-10-CM | POA: Diagnosis not present

## 2020-08-23 DIAGNOSIS — Z3A34 34 weeks gestation of pregnancy: Secondary | ICD-10-CM | POA: Diagnosis not present

## 2020-09-01 DIAGNOSIS — O4103X9 Oligohydramnios, third trimester, other fetus: Secondary | ICD-10-CM | POA: Diagnosis not present

## 2020-09-01 DIAGNOSIS — Z3493 Encounter for supervision of normal pregnancy, unspecified, third trimester: Secondary | ICD-10-CM | POA: Diagnosis not present

## 2020-09-01 DIAGNOSIS — O24415 Gestational diabetes mellitus in pregnancy, controlled by oral hypoglycemic drugs: Secondary | ICD-10-CM | POA: Diagnosis not present

## 2020-09-01 DIAGNOSIS — Z3A34 34 weeks gestation of pregnancy: Secondary | ICD-10-CM | POA: Diagnosis not present

## 2020-09-05 ENCOUNTER — Encounter (HOSPITAL_COMMUNITY): Payer: Self-pay | Admitting: Obstetrics

## 2020-09-05 ENCOUNTER — Other Ambulatory Visit: Payer: Self-pay

## 2020-09-05 ENCOUNTER — Inpatient Hospital Stay (HOSPITAL_COMMUNITY)
Admission: AD | Admit: 2020-09-05 | Discharge: 2020-09-05 | Disposition: A | Discharge status: Home / Self Care | Payer: Medicaid Other | Attending: Obstetrics | Admitting: Obstetrics

## 2020-09-05 DIAGNOSIS — Z87891 Personal history of nicotine dependence: Secondary | ICD-10-CM | POA: Insufficient documentation

## 2020-09-05 DIAGNOSIS — O99613 Diseases of the digestive system complicating pregnancy, third trimester: Secondary | ICD-10-CM | POA: Insufficient documentation

## 2020-09-05 DIAGNOSIS — K529 Noninfective gastroenteritis and colitis, unspecified: Secondary | ICD-10-CM | POA: Diagnosis not present

## 2020-09-05 DIAGNOSIS — R109 Unspecified abdominal pain: Secondary | ICD-10-CM | POA: Diagnosis not present

## 2020-09-05 DIAGNOSIS — Z3A35 35 weeks gestation of pregnancy: Secondary | ICD-10-CM | POA: Diagnosis not present

## 2020-09-05 DIAGNOSIS — Z3689 Encounter for other specified antenatal screening: Secondary | ICD-10-CM | POA: Diagnosis not present

## 2020-09-05 DIAGNOSIS — O212 Late vomiting of pregnancy: Secondary | ICD-10-CM | POA: Insufficient documentation

## 2020-09-05 HISTORY — DX: Gestational diabetes mellitus in pregnancy, unspecified control: O24.419

## 2020-09-05 HISTORY — DX: Gastro-esophageal reflux disease without esophagitis: K21.9

## 2020-09-05 LAB — URINALYSIS, ROUTINE W REFLEX MICROSCOPIC
Bilirubin Urine: NEGATIVE
Glucose, UA: NEGATIVE mg/dL
Hgb urine dipstick: NEGATIVE
Ketones, ur: NEGATIVE mg/dL
Nitrite: NEGATIVE
Protein, ur: NEGATIVE mg/dL
Specific Gravity, Urine: 1.01 (ref 1.005–1.030)
pH: 7 (ref 5.0–8.0)

## 2020-09-05 LAB — CBC
HCT: 31.8 % — ABNORMAL LOW (ref 36.0–46.0)
Hemoglobin: 10.3 g/dL — ABNORMAL LOW (ref 12.0–15.0)
MCH: 28 pg (ref 26.0–34.0)
MCHC: 32.4 g/dL (ref 30.0–36.0)
MCV: 86.4 fL (ref 80.0–100.0)
Platelets: 303 10*3/uL (ref 150–400)
RBC: 3.68 MIL/uL — ABNORMAL LOW (ref 3.87–5.11)
RDW: 13.9 % (ref 11.5–15.5)
WBC: 13.8 10*3/uL — ABNORMAL HIGH (ref 4.0–10.5)
nRBC: 0 % (ref 0.0–0.2)

## 2020-09-05 LAB — COMPREHENSIVE METABOLIC PANEL
ALT: 9 U/L (ref 0–44)
AST: 17 U/L (ref 15–41)
Albumin: 2.7 g/dL — ABNORMAL LOW (ref 3.5–5.0)
Alkaline Phosphatase: 159 U/L — ABNORMAL HIGH (ref 38–126)
Anion gap: 11 (ref 5–15)
BUN: <5 mg/dL — ABNORMAL LOW (ref 6–20)
CO2: 18 mmol/L — ABNORMAL LOW (ref 22–32)
Calcium: 8.5 mg/dL — ABNORMAL LOW (ref 8.9–10.3)
Chloride: 107 mmol/L (ref 98–111)
Creatinine, Ser: 0.57 mg/dL (ref 0.44–1.00)
GFR, Estimated: >60 mL/min (ref >60)
Glucose, Bld: 84 mg/dL (ref 70–99)
Potassium: 3.6 mmol/L (ref 3.5–5.1)
Sodium: 136 mmol/L (ref 135–145)
Total Bilirubin: 0.8 mg/dL (ref 0.3–1.2)
Total Protein: 6.1 g/dL — ABNORMAL LOW (ref 6.5–8.1)

## 2020-09-05 LAB — OB RESULTS CONSOLE GBS: GBS: POSITIVE

## 2020-09-05 MED ORDER — ONDANSETRON HCL 4 MG/2ML IJ SOLN
4.0000 mg | Freq: Once | INTRAMUSCULAR | Status: AC
  Administered 2020-09-05: 4 mg via INTRAVENOUS
  Filled 2020-09-05: qty 2

## 2020-09-05 MED ORDER — FAMOTIDINE IN NACL 20-0.9 MG/50ML-% IV SOLN
20.0000 mg | Freq: Once | INTRAVENOUS | Status: AC
  Administered 2020-09-05: 20 mg via INTRAVENOUS
  Filled 2020-09-05: qty 50

## 2020-09-05 MED ORDER — LACTATED RINGERS IV BOLUS
1000.0000 mL | Freq: Once | INTRAVENOUS | Status: AC
  Administered 2020-09-05: 1000 mL via INTRAVENOUS

## 2020-09-05 NOTE — MAU Provider Note (Signed)
History     CSN: [id number]  Arrival date and time: 09/05/20 1402   Event Date/Time   First Provider Initiated Contact with Patient 09/05/20 1520      Chief Complaint  Patient presents with  . Nausea  . Abdominal Pain   Breanna Nguyen is a 25 y.o. G1P0 at [redacted]w[redacted]d who presents to MAU with complaints of nausea/emesis and abdominal pain. Patient reports that she went to the chinese buffet this morning around 1130. She reports that she got nauseous while there and during eating. Once she was done eating patient reports immediately vomiting and continuing since arrival to MAU. Patient reports that lower abdominal pain started occurring while she was having emesis. Describes the pain as a ache, rates 7/10. She denies diarrhea, HA, vision changes, RUQ pain, vaginal bleeding. Patient is currently being treated for yeast infection. +FM.    OB History    Gravida  1   Para      Term      Preterm      AB      Living        SAB      IAB      Ectopic      Multiple      Live Births              Past Medical History:  Diagnosis Date  . GERD (gastroesophageal reflux disease)   . Gestational diabetes     Past Surgical History:  Procedure Laterality Date  . NO PAST SURGERIES      Family History  Problem Relation Age of Onset  . Healthy Mother   . Healthy Father     Social History   Tobacco Use  . Smoking status: Former Games developer  . Smokeless tobacco: Never Used  Vaping Use  . Vaping Use: Former  Substance Use Topics  . Alcohol use: No  . Drug use: Yes    Types: Marijuana    Comment: Last Use December 2021    Allergies: No Known Allergies  Medications Prior to Admission  Medication Sig Dispense Refill Last Dose  . famotidine (PEPCID) 20 MG tablet Take 20 mg by mouth 2 (two) times daily.   09/05/2020 at 0000  . metFORMIN (GLUCOPHAGE) 500 MG tablet Take by mouth 2 (two) times daily with a meal.   09/05/2020 at 1200  . omeprazole (PRILOSEC) 20 MG capsule Take 20  mg by mouth daily.   09/02/2020  . esomeprazole (NEXIUM) 40 MG capsule Take 1 capsule (40 mg total) by mouth daily. 30 capsule 0   . metoCLOPramide (REGLAN) 10 MG tablet Take 1 tablet (10 mg total) by mouth every 8 (eight) hours as needed for nausea or vomiting. 15 tablet 0   . ondansetron (ZOFRAN ODT) 4 MG disintegrating tablet Take 1 tablet (4 mg total) by mouth every 8 (eight) hours as needed for nausea or vomiting. 20 tablet 0   . ondansetron (ZOFRAN) 4 MG tablet Take 1 tablet (4 mg total) by mouth every 6 (six) hours. 12 tablet 0   . pantoprazole (PROTONIX) 20 MG tablet Take 1 tablet (20 mg total) by mouth daily. (Patient not taking: Reported on 12/29/2017) 14 tablet 0   . potassium chloride 20 MEQ TBCR Take 20 mEq by mouth daily. 10 tablet 0   . potassium chloride 20 MEQ/15ML (10%) SOLN Take 15 mLs (20 mEq total) by mouth daily for 7 days. 240 mL 0   . ranitidine (ZANTAC) 150 MG capsule Take  1 capsule (150 mg total) by mouth daily. (Patient not taking: Reported on 12/29/2017) 30 capsule 0     Review of Systems  Constitutional: Negative.   Respiratory: Negative.   Cardiovascular: Negative.   Gastrointestinal: Positive for abdominal pain, nausea and vomiting. Negative for constipation and diarrhea.  Genitourinary: Negative.   Musculoskeletal: Negative.   Neurological: Negative.   Psychiatric/Behavioral: Negative.    Physical Exam   Blood pressure 132/77, pulse (!) 57, temperature 98.2 F (36.8 C), temperature source Oral, height 5\' 2"  (1.575 m), weight 70.4 kg, SpO2 98 %.  Physical Exam Vitals and nursing note reviewed.  HENT:     Head: Normocephalic.  Cardiovascular:     Rate and Rhythm: Normal rate and regular rhythm.  Pulmonary:     Effort: Pulmonary effort is normal.     Breath sounds: Normal breath sounds.  Abdominal:     Palpations: Abdomen is soft. There is no mass.     Tenderness: There is no abdominal tenderness. There is no guarding.     Comments: Gravid appropriate  for gestational age  Musculoskeletal:     Right lower leg: No edema.     Left lower leg: No edema.  Skin:    General: Skin is warm and dry.  Neurological:     Mental Status: She is alert and oriented to person, place, and time.  Psychiatric:        Mood and Affect: Mood normal.        Behavior: Behavior normal.        Thought Content: Thought content normal.    Fetal monitoring:  140/moderate/+accels/ no decelerations  UI with irregular contractions   Dilation: Closed Effacement (%): 30 Cervical Position: Middle Station: -3 Presentation: Vertex Exam by:: [email or web address] CNM  MAU Course  Procedures  MDM Due to new onset of nausea and emesis after buffet - most likely gastroenteritis, will hydrate patient and given IV medication for nausea. Cervix closed.   Orders Placed This Encounter  Procedures  . Culture, OB Urine  . Urinalysis, Routine w reflex microscopic Urine, Clean Catch  . CBC  . Comprehensive metabolic panel  . Insert peripheral IV   Meds ordered this encounter  Medications  . lactated ringers bolus 1,000 mL  . ondansetron (ZOFRAN) injection 4 mg  . famotidine (PEPCID) IVPB 20 mg premix   Reassessment after fluids and zofran. Patient reports nausea is resolved and abdominal pain is resolved.  Discussed reasons to return to MAU. Follow up as scheduled in the office. Return to MAU as needed. Pt stable at time of discharge. NST reactive and reassuring.   Assessment and Plan   1. Gastroenteritis, acute   2. [redacted] weeks gestation of pregnancy   3. NST (non-stress test) reactive    Discharge home Follow up as scheduled in the office for prenatal care Return to MAU as needed for reasons discussed and/or emergencies    Follow-up Information    Ob/Gyn, [facility] Follow up.   Contact information: [address] [place] [phone]              Allergies as of 09/05/2020   No Known Allergies     Medication List    TAKE  these medications   esomeprazole 40 MG capsule Commonly known as: NEXIUM Take 1 capsule (40 mg total) by mouth daily.   famotidine 20 MG tablet Commonly known as: PEPCID Take 20 mg by mouth 2 (two) times daily.  metFORMIN 500 MG tablet Commonly known as: GLUCOPHAGE Take by mouth 2 (two) times daily with a meal.   metoCLOPramide 10 MG tablet Commonly known as: REGLAN Take 1 tablet (10 mg total) by mouth every 8 (eight) hours as needed for nausea or vomiting.   omeprazole 20 MG capsule Commonly known as: PRILOSEC Take 20 mg by mouth daily.   ondansetron 4 MG disintegrating tablet Commonly known as: Zofran ODT Take 1 tablet (4 mg total) by mouth every 8 (eight) hours as needed for nausea or vomiting.   ondansetron 4 MG tablet Commonly known as: ZOFRAN Take 1 tablet (4 mg total) by mouth every 6 (six) hours.   pantoprazole 20 MG tablet Commonly known as: PROTONIX Take 1 tablet (20 mg total) by mouth daily.   Potassium Chloride ER 20 MEQ Tbcr Take 20 mEq by mouth daily.   potassium chloride 20 MEQ/15ML (10%) Soln Take 15 mLs (20 mEq total) by mouth daily for 7 days.   ranitidine 150 MG capsule Commonly known as: ZANTAC Take 1 capsule (150 mg total) by mouth daily.       Sharyon Cable CNM 09/05/2020, 5:14 PM

## 2020-09-05 NOTE — MAU Note (Signed)
Akiba Kochan is a 25 y.o. at [redacted]w[redacted]d here in MAU reporting: states about 2-3 hours she went out to eat at a chinese buffet and after she ate she vomited about 10 times and is now having lower abdominal pain. Still feeling sick.   Onset of complaint: today  Pain score: 7/10  Vitals:   09/05/20 1419  BP: 119/73  Pulse: 68  Temp: 98.2 F (36.8 C)  SpO2: 98%     FHT: +FM  Lab orders placed from triage: UA

## 2020-09-07 DIAGNOSIS — Z3A36 36 weeks gestation of pregnancy: Secondary | ICD-10-CM | POA: Diagnosis not present

## 2020-09-07 DIAGNOSIS — O4103X1 Oligohydramnios, third trimester, fetus 1: Secondary | ICD-10-CM | POA: Diagnosis not present

## 2020-09-07 DIAGNOSIS — O24425 Gestational diabetes mellitus in childbirth, controlled by oral hypoglycemic drugs: Secondary | ICD-10-CM | POA: Diagnosis not present

## 2020-09-07 LAB — CULTURE, OB URINE: Culture: 50000 — AB

## 2020-09-08 ENCOUNTER — Other Ambulatory Visit: Payer: Self-pay | Admitting: Certified Nurse Midwife

## 2020-09-08 DIAGNOSIS — R8271 Bacteriuria: Secondary | ICD-10-CM

## 2020-09-08 MED ORDER — AMOXICILLIN 500 MG PO CAPS: 500.0000 mg | ORAL_CAPSULE | Freq: Three times a day (TID) | ORAL | 0 refills | Status: AC

## 2020-09-08 NOTE — Progress Notes (Signed)
GBS bacteruria on OB urine culture. Results called to pt, prescription sent to pharmacy. Pt to follow up with regular OB provider.  Edd Arbour, CNM, MSN, IBCLC Certified Nurse Midwife, [facility]

## 2020-09-11 DIAGNOSIS — O24425 Gestational diabetes mellitus in childbirth, controlled by oral hypoglycemic drugs: Secondary | ICD-10-CM | POA: Diagnosis not present

## 2020-09-14 DIAGNOSIS — Z3480 Encounter for supervision of other normal pregnancy, unspecified trimester: Secondary | ICD-10-CM | POA: Diagnosis not present

## 2020-09-14 DIAGNOSIS — O24415 Gestational diabetes mellitus in pregnancy, controlled by oral hypoglycemic drugs: Secondary | ICD-10-CM | POA: Diagnosis not present

## 2020-09-14 DIAGNOSIS — Z3A36 36 weeks gestation of pregnancy: Secondary | ICD-10-CM | POA: Diagnosis not present

## 2020-09-14 DIAGNOSIS — O4103X1 Oligohydramnios, third trimester, fetus 1: Secondary | ICD-10-CM | POA: Diagnosis not present

## 2020-09-20 ENCOUNTER — Other Ambulatory Visit: Payer: Self-pay | Admitting: Obstetrics and Gynecology

## 2020-09-20 DIAGNOSIS — Z3A37 37 weeks gestation of pregnancy: Secondary | ICD-10-CM | POA: Diagnosis not present

## 2020-09-20 DIAGNOSIS — O24419 Gestational diabetes mellitus in pregnancy, unspecified control: Secondary | ICD-10-CM | POA: Diagnosis not present

## 2020-09-22 ENCOUNTER — Encounter (HOSPITAL_COMMUNITY): Payer: Self-pay | Admitting: Obstetrics and Gynecology

## 2020-09-22 ENCOUNTER — Inpatient Hospital Stay (HOSPITAL_COMMUNITY)
Admission: AD | Admit: 2020-09-22 | Discharge: 2020-09-22 | Disposition: A | Discharge status: Home / Self Care | Payer: Medicaid Other | Attending: Obstetrics and Gynecology | Admitting: Obstetrics and Gynecology

## 2020-09-22 ENCOUNTER — Inpatient Hospital Stay (HOSPITAL_BASED_OUTPATIENT_CLINIC_OR_DEPARTMENT_OTHER): Payer: Medicaid Other

## 2020-09-22 ENCOUNTER — Other Ambulatory Visit: Payer: Self-pay

## 2020-09-22 DIAGNOSIS — O368330 Maternal care for abnormalities of the fetal heart rate or rhythm, third trimester, not applicable or unspecified: Secondary | ICD-10-CM

## 2020-09-22 DIAGNOSIS — Z3A37 37 weeks gestation of pregnancy: Secondary | ICD-10-CM | POA: Diagnosis not present

## 2020-09-22 DIAGNOSIS — Z3689 Encounter for other specified antenatal screening: Secondary | ICD-10-CM

## 2020-09-22 DIAGNOSIS — O368390 Maternal care for abnormalities of the fetal heart rate or rhythm, unspecified trimester, not applicable or unspecified: Secondary | ICD-10-CM

## 2020-09-22 DIAGNOSIS — O4103X1 Oligohydramnios, third trimester, fetus 1: Secondary | ICD-10-CM | POA: Diagnosis not present

## 2020-09-22 DIAGNOSIS — O24415 Gestational diabetes mellitus in pregnancy, controlled by oral hypoglycemic drugs: Secondary | ICD-10-CM | POA: Diagnosis not present

## 2020-09-22 NOTE — MAU Note (Signed)
Sent from MD office for EFM and ultrasound secondary FHR "skipping a beat" during monitoring @ office visit. Denies VB or LOF.  Endorses +FM.

## 2020-09-22 NOTE — MAU Provider Note (Addendum)
Event Date/Time   First Provider Initiated Contact with Patient 09/22/20 1623     S Ms. Breanna Nguyen is a 25 y.o. G1P0 pregnant female [redacted]w[redacted]d who presents to MAU today, sent by her OB who heard a fetal arrhythmia using the Doppler. Pt has weekly antenatal surveillance for A2GDM with planned IOL at 39wks. BPP in office = 8/8, NST reactive, MD wanted additional monitoring with formal U/S. Denies DFM, vaginal bleeding, cramping or LOF.  O BP 126/88   Pulse 61   Temp 98 F (36.7 C) (Oral)   Resp 18   Ht 5\' 2"  (1.575 m)   Wt 157 lb 3.2 oz (71.3 kg)   SpO2 100%   BMI 28.75 kg/m   (one elevated BP in MAU, but happened while discussing fetal arrhythmia, pt visibly nervous. Normotensive for remaining BPs.)  Physical Exam Vitals and nursing note reviewed.  Constitutional:      General: She is not in acute distress.    Appearance: She is not ill-appearing.  HENT:     Mouth/Throat:     Mouth: Mucous membranes are moist.  Eyes:     Pupils: Pupils are equal, round, and reactive to light.  Cardiovascular:     Rate and Rhythm: Normal rate.     Pulses: Normal pulses.  Pulmonary:     Effort: Pulmonary effort is normal.  Musculoskeletal:        General: Normal range of motion.  Skin:    General: Skin is warm and dry.     Capillary Refill: Capillary refill takes less than 2 seconds.  Neurological:     Mental Status: She is alert and oriented to person, place, and time.  Psychiatric:        Mood and Affect: Mood normal.        Behavior: Behavior normal.        Thought Content: Thought content normal.        Judgment: Judgment normal.    MFM OB LIMITED  Result Date: 09/22/2020 ----------------------------------------------------------------------  OBSTETRICS REPORT                       (Signed Final 09/22/2020 05:09 pm) ---------------------------------------------------------------------- Patient Info  ID #:       09/24/2020                          D.O.B.:  1996/03/02 (25 yrs)   Name:       Breanna Nguyen                 Visit Date: 09/22/2020 04:28 pm ---------------------------------------------------------------------- Performed By  Attending:        09/24/2020      Ref. [facility]  Performed By:     Marcellina Millin          Secondary Phy.:   [facility] MAU/Triage                    RDMS  Referred By:      Judye Bos              [facility] ---------------------------------------------------------------------- Orders  #  Description                           Code        Ordered By  1  [place] MFM OB LIMITED                     99242.68    JAMILLA WALKER ----------------------------------------------------------------------  #  Order #                     Accession #                Episode #  1  [id number]                   [id number]                 [id number] ---------------------------------------------------------------------- Indications  [redacted] weeks gestation of pregnancy                Z3A.37  Fetal arrhythmia affecting pregnancy,          O36.8390  antepartum ---------------------------------------------------------------------- Fetal Evaluation  Num Of Fetuses:         1  Fetal Heart Rate(bpm):  143  Cardiac Activity:       Arrhythmia noted  Presentation:           Cephalic  Placenta:               Posterior  Amniotic Fluid  AFI FV:      Within normal limits  AFI Sum(cm)     %Tile       Largest Pocket(cm)  11.2            34          4.6  RUQ(cm)       RLQ(cm)       LUQ(cm)        LLQ(cm)  4.6           2.8           2.5            1.3 ---------------------------------------------------------------------- OB History  Gravidity:    1         Term:   0        Prem:   0        SAB:   0  TOP:          0       Ectopic:  0         Living: 0 ---------------------------------------------------------------------- Gestational Age  Clinical EDD:  37w 4d  EDD:   10/09/20  Best:          37w 4d     Det. By:  Clinical EDD             EDD:   10/09/20 ---------------------------------------------------------------------- Anatomy  Stomach:               Appears normal, left   Bladder:                Appears normal                         sided ---------------------------------------------------------------------- Cervix Uterus Adnexa  Cervix  Not visualized (advanced GA >24wks) ---------------------------------------------------------------------- Impression  Limited exam to assess fetal arrhythmia.  Normal fetal heart rate with abnormal rhythm.  There is no evidence of fetal tachycardia or hydrops.  Today's findings suggest premature atrial contractions  occuring regularly irregular every 10-15 bpm.  Premature atrial contractions (PACs), the most common fetal  arrhythmia, manifest as an irregularly irregular variability in  fetal heart rate, usually beginning between 18 and 24 weeks'  gestation, but occasionally appearing initially during the third  trimester. Rarely, PACs are associated with intermittent SVT.  PACs may be exacerbated by ingestion of caffeine,  decongestant medications (stimulants), or tobacco. Typically,  PACs resolve spontaneously within 2 to 3 weeks after  diagnosis. PACs do not represent any real risk to the fetus,  and they do not require treatment. However, a small  percentage of fetuses with isolated PACs develop  supraventricular tachycardia (SVT). We recommend weekly  fetal auscultation in office for surveillance if SVT is noted  please refer back to MFM or consider delivery if at 39 week  with postnatal echocardiogram and cardiology consultation. ----------------------------------------------------------------------               Lin Landsman, MD Electronically Signed Final Report    09/22/2020 05:09 pm ----------------------------------------------------------------------  Fetal Tracing: reactive Baseline: 135 Variability: moderate Accelerations: 15x15 Decelerations: none Toco: irregular and mild, pt only feels ctx with extended periods of walking, not uncomfortable now so pelvic exam deferred  Dr. Judeth Cornfield called initial report to MAU - reported it to Dr. Henderson Cloud  A Fetal arrhythmia in the third trimester - MFM recommendations are already being followed for A2GDM NST reactive  P Discharge from MAU in stable condition with third trimester precautions Follow up at [facility] as scheduled for ongoing prenatal care  Bernerd Limbo, CNM 09/22/2020 5:30 PM

## 2020-09-22 NOTE — Discharge Instructions (Signed)
Today's findings suggest premature atrial contractions  occuring regularly irregular every 10-15 bpm.  Premature atrial contractions (PACs), the most common fetal  arrhythmia, manifest as an irregularly irregular variability in  fetal heart rate, usually beginning between 18 and 24 weeks'  gestation, but occasionally appearing initially during the third  trimester. Rarely, PACs are associated with intermittent SVT.  PACs may be exacerbated by ingestion of caffeine,  decongestant medications (stimulants), or tobacco. Typically,  PACs resolve spontaneously within 2 to 3 weeks after  diagnosis. PACs do not represent any real risk to the fetus,  and they do not require treatment. However, a small  percentage of fetuses with isolated PACs develop  supraventricular tachycardia (SVT). We recommend weekly  fetal auscultation in office for surveillance if SVT is noted  please refer back to MFM or consider delivery if at 39 week  with postnatal echocardiogram and cardiology consultation.

## 2020-09-26 ENCOUNTER — Encounter (HOSPITAL_COMMUNITY): Payer: Self-pay | Admitting: *Deleted

## 2020-09-26 ENCOUNTER — Telehealth (HOSPITAL_COMMUNITY): Payer: Self-pay | Admitting: *Deleted

## 2020-09-26 NOTE — Telephone Encounter (Signed)
Preadmission screen  

## 2020-09-27 DIAGNOSIS — Z3A38 38 weeks gestation of pregnancy: Secondary | ICD-10-CM | POA: Diagnosis not present

## 2020-09-27 DIAGNOSIS — O24415 Gestational diabetes mellitus in pregnancy, controlled by oral hypoglycemic drugs: Secondary | ICD-10-CM | POA: Diagnosis not present

## 2020-09-27 DIAGNOSIS — O4103X1 Oligohydramnios, third trimester, fetus 1: Secondary | ICD-10-CM | POA: Diagnosis not present

## 2020-09-29 DIAGNOSIS — Z3A38 38 weeks gestation of pregnancy: Secondary | ICD-10-CM | POA: Diagnosis not present

## 2020-09-29 DIAGNOSIS — O24419 Gestational diabetes mellitus in pregnancy, unspecified control: Secondary | ICD-10-CM | POA: Diagnosis not present

## 2020-10-02 ENCOUNTER — Other Ambulatory Visit (HOSPITAL_COMMUNITY)
Admission: RE | Admit: 2020-10-02 | Discharge: 2020-10-02 | Disposition: A | Discharge status: Home / Self Care | Payer: Medicaid Other | Source: Ambulatory Visit | Attending: Obstetrics and Gynecology | Admitting: Obstetrics and Gynecology

## 2020-10-02 DIAGNOSIS — Z01812 Encounter for preprocedural laboratory examination: Secondary | ICD-10-CM | POA: Insufficient documentation

## 2020-10-02 DIAGNOSIS — Z20822 Contact with and (suspected) exposure to covid-19: Secondary | ICD-10-CM | POA: Insufficient documentation

## 2020-10-02 LAB — SARS CORONAVIRUS 2 (TAT 6-24 HRS): SARS Coronavirus 2: NEGATIVE

## 2020-10-03 ENCOUNTER — Other Ambulatory Visit: Payer: Self-pay

## 2020-10-03 ENCOUNTER — Inpatient Hospital Stay (HOSPITAL_COMMUNITY): Payer: Medicaid Other | Admitting: Anesthesiology

## 2020-10-03 ENCOUNTER — Inpatient Hospital Stay (HOSPITAL_COMMUNITY)
Admission: AD | Admit: 2020-10-03 | Discharge: 2020-10-05 | DRG: 806 | Disposition: A | Discharge status: Home / Self Care | Payer: Medicaid Other | Attending: Obstetrics and Gynecology | Admitting: Obstetrics and Gynecology

## 2020-10-03 ENCOUNTER — Inpatient Hospital Stay (HOSPITAL_COMMUNITY): Payer: Medicaid Other

## 2020-10-03 ENCOUNTER — Encounter (HOSPITAL_COMMUNITY): Payer: Self-pay | Admitting: Obstetrics and Gynecology

## 2020-10-03 DIAGNOSIS — Z87891 Personal history of nicotine dependence: Secondary | ICD-10-CM

## 2020-10-03 DIAGNOSIS — O99824 Streptococcus B carrier state complicating childbirth: Secondary | ICD-10-CM | POA: Diagnosis present

## 2020-10-03 DIAGNOSIS — O24425 Gestational diabetes mellitus in childbirth, controlled by oral hypoglycemic drugs: Principal | ICD-10-CM | POA: Diagnosis present

## 2020-10-03 DIAGNOSIS — O99324 Drug use complicating childbirth: Secondary | ICD-10-CM | POA: Diagnosis present

## 2020-10-03 DIAGNOSIS — O1494 Unspecified pre-eclampsia, complicating childbirth: Secondary | ICD-10-CM | POA: Diagnosis present

## 2020-10-03 DIAGNOSIS — Z20822 Contact with and (suspected) exposure to covid-19: Secondary | ICD-10-CM | POA: Diagnosis present

## 2020-10-03 DIAGNOSIS — O24429 Gestational diabetes mellitus in childbirth, unspecified control: Secondary | ICD-10-CM | POA: Diagnosis not present

## 2020-10-03 DIAGNOSIS — F1290 Cannabis use, unspecified, uncomplicated: Secondary | ICD-10-CM | POA: Diagnosis present

## 2020-10-03 DIAGNOSIS — O134 Gestational [pregnancy-induced] hypertension without significant proteinuria, complicating childbirth: Secondary | ICD-10-CM | POA: Diagnosis not present

## 2020-10-03 DIAGNOSIS — O24419 Gestational diabetes mellitus in pregnancy, unspecified control: Secondary | ICD-10-CM | POA: Diagnosis present

## 2020-10-03 DIAGNOSIS — Z3A39 39 weeks gestation of pregnancy: Secondary | ICD-10-CM | POA: Diagnosis not present

## 2020-10-03 DIAGNOSIS — O24113 Pre-existing diabetes mellitus, type 2, in pregnancy, third trimester: Secondary | ICD-10-CM | POA: Diagnosis not present

## 2020-10-03 DIAGNOSIS — O1490 Unspecified pre-eclampsia, unspecified trimester: Secondary | ICD-10-CM

## 2020-10-03 LAB — COMPREHENSIVE METABOLIC PANEL
ALT: 9 U/L (ref 0–44)
AST: 16 U/L (ref 15–41)
Albumin: 2.5 g/dL — ABNORMAL LOW (ref 3.5–5.0)
Alkaline Phosphatase: 186 U/L — ABNORMAL HIGH (ref 38–126)
Anion gap: 7 (ref 5–15)
BUN: 5 mg/dL — ABNORMAL LOW (ref 6–20)
CO2: 20 mmol/L — ABNORMAL LOW (ref 22–32)
Calcium: 8.4 mg/dL — ABNORMAL LOW (ref 8.9–10.3)
Chloride: 105 mmol/L (ref 98–111)
Creatinine, Ser: 0.75 mg/dL (ref 0.44–1.00)
GFR, Estimated: >60 mL/min (ref >60)
Glucose, Bld: 78 mg/dL (ref 70–99)
Potassium: 3.7 mmol/L (ref 3.5–5.1)
Sodium: 132 mmol/L — ABNORMAL LOW (ref 135–145)
Total Bilirubin: 0.9 mg/dL (ref 0.3–1.2)
Total Protein: 5.9 g/dL — ABNORMAL LOW (ref 6.5–8.1)

## 2020-10-03 LAB — TYPE AND SCREEN
ABO/RH(D): O POS
Antibody Screen: NEGATIVE

## 2020-10-03 LAB — CBC
HCT: 31.8 % — ABNORMAL LOW (ref 36.0–46.0)
Hemoglobin: 10.1 g/dL — ABNORMAL LOW (ref 12.0–15.0)
MCH: 26.4 pg (ref 26.0–34.0)
MCHC: 31.8 g/dL (ref 30.0–36.0)
MCV: 83 fL (ref 80.0–100.0)
Platelets: 327 10*3/uL (ref 150–400)
RBC: 3.83 MIL/uL — ABNORMAL LOW (ref 3.87–5.11)
RDW: 14.8 % (ref 11.5–15.5)
WBC: 14 10*3/uL — ABNORMAL HIGH (ref 4.0–10.5)
nRBC: 0 % (ref 0.0–0.2)

## 2020-10-03 LAB — PROTEIN / CREATININE RATIO, URINE
Creatinine, Urine: 38.25 mg/dL
Protein Creatinine Ratio: 0.47 mg/mg{Cre} — ABNORMAL HIGH (ref 0.00–0.15)
Total Protein, Urine: 18 mg/dL

## 2020-10-03 LAB — RPR: RPR Ser Ql: NONREACTIVE

## 2020-10-03 LAB — GLUCOSE, CAPILLARY
Glucose-Capillary: 77 mg/dL (ref 70–99)
Glucose-Capillary: 89 mg/dL (ref 70–99)
Glucose-Capillary: 74 mg/dL (ref 70–99)
Glucose-Capillary: 71 mg/dL (ref 70–99)

## 2020-10-03 MED ORDER — OXYCODONE-ACETAMINOPHEN 5-325 MG PO TABS: 2.0000 | ORAL_TABLET | ORAL | Status: DC | PRN

## 2020-10-03 MED ORDER — FENTANYL CITRATE (PF) 100 MCG/2ML IJ SOLN: 50.0000 ug | INTRAMUSCULAR | Status: DC | PRN

## 2020-10-03 MED ORDER — ONDANSETRON HCL 4 MG/2ML IJ SOLN
4.0000 mg | Freq: Four times a day (QID) | INTRAMUSCULAR | Status: DC | PRN
  Administered 2020-10-03: 4 mg via INTRAVENOUS
  Filled 2020-10-03: qty 2

## 2020-10-03 MED ORDER — EPHEDRINE 5 MG/ML INJ: 10.0000 mg | INTRAVENOUS | Status: DC | PRN

## 2020-10-03 MED ORDER — ZOLPIDEM TARTRATE 5 MG PO TABS: 5.0000 mg | ORAL_TABLET | Freq: Every evening | ORAL | Status: DC | PRN

## 2020-10-03 MED ORDER — OXYCODONE HCL 5 MG PO TABS
5.0000 mg | ORAL_TABLET | ORAL | Status: DC | PRN
Start: 2020-10-03 — End: 2020-10-05

## 2020-10-03 MED ORDER — DIBUCAINE (PERIANAL) 1 % EX OINT: 1.0000 "application " | TOPICAL_OINTMENT | CUTANEOUS | Status: DC | PRN

## 2020-10-03 MED ORDER — DIPHENHYDRAMINE HCL 25 MG PO CAPS: 25.0000 mg | ORAL_CAPSULE | Freq: Four times a day (QID) | ORAL | Status: DC | PRN

## 2020-10-03 MED ORDER — OXYTOCIN-SODIUM CHLORIDE 30-0.9 UT/500ML-% IV SOLN
1.0000 m[IU]/min | INTRAVENOUS | Status: DC
  Administered 2020-10-03: 2 m[IU]/min via INTRAVENOUS
  Filled 2020-10-03: qty 500

## 2020-10-03 MED ORDER — LACTATED RINGERS IV SOLN: 500.0000 mL | INTRAVENOUS | Status: DC | PRN

## 2020-10-03 MED ORDER — OXYCODONE-ACETAMINOPHEN 5-325 MG PO TABS: 1.0000 | ORAL_TABLET | ORAL | Status: DC | PRN

## 2020-10-03 MED ORDER — SENNOSIDES-DOCUSATE SODIUM 8.6-50 MG PO TABS
2.0000 | ORAL_TABLET | Freq: Every day | ORAL | Status: DC
  Administered 2020-10-04 – 2020-10-05 (×2): 2 via ORAL
  Filled 2020-10-03 (×2): qty 2

## 2020-10-03 MED ORDER — FENTANYL-BUPIVACAINE-NACL 0.5-0.125-0.9 MG/250ML-% EP SOLN
EPIDURAL | Status: DC | PRN
  Administered 2020-10-03: 12 mL/h via EPIDURAL

## 2020-10-03 MED ORDER — SODIUM CHLORIDE 0.9 % IV SOLN
5.0000 10*6.[IU] | Freq: Once | INTRAVENOUS | Status: AC
  Administered 2020-10-03: 5 10*6.[IU] via INTRAVENOUS
  Filled 2020-10-03: qty 5

## 2020-10-03 MED ORDER — TERBUTALINE SULFATE 1 MG/ML IJ SOLN: 0.2500 mg | Freq: Once | INTRAMUSCULAR | Status: DC | PRN

## 2020-10-03 MED ORDER — DIPHENHYDRAMINE HCL 50 MG/ML IJ SOLN: 12.5000 mg | INTRAMUSCULAR | Status: DC | PRN

## 2020-10-03 MED ORDER — OXYTOCIN-SODIUM CHLORIDE 30-0.9 UT/500ML-% IV SOLN
2.5000 [IU]/h | INTRAVENOUS | Status: DC
  Administered 2020-10-03: 2.5 [IU]/h via INTRAVENOUS

## 2020-10-03 MED ORDER — PENICILLIN G POT IN DEXTROSE 60000 UNIT/ML IV SOLN
3.0000 10*6.[IU] | INTRAVENOUS | Status: DC
  Administered 2020-10-03 (×3): 3 10*6.[IU] via INTRAVENOUS
  Filled 2020-10-03 (×3): qty 50

## 2020-10-03 MED ORDER — LACTATED RINGERS IV SOLN: INTRAVENOUS | Status: DC

## 2020-10-03 MED ORDER — FENTANYL-BUPIVACAINE-NACL 0.5-0.125-0.9 MG/250ML-% EP SOLN
12.0000 mL/h | EPIDURAL | Status: DC | PRN
  Filled 2020-10-03: qty 250

## 2020-10-03 MED ORDER — LIDOCAINE HCL (PF) 1 % IJ SOLN
INTRAMUSCULAR | Status: DC | PRN
  Administered 2020-10-03: 5 mL via EPIDURAL

## 2020-10-03 MED ORDER — SIMETHICONE 80 MG PO CHEW: 80.0000 mg | CHEWABLE_TABLET | ORAL | Status: DC | PRN

## 2020-10-03 MED ORDER — LIDOCAINE HCL (PF) 1 % IJ SOLN: 30.0000 mL | INTRAMUSCULAR | Status: DC | PRN

## 2020-10-03 MED ORDER — PHENYLEPHRINE 40 MCG/ML (10ML) SYRINGE FOR IV PUSH (FOR BLOOD PRESSURE SUPPORT)
80.0000 ug | PREFILLED_SYRINGE | INTRAVENOUS | Status: DC | PRN
  Filled 2020-10-03: qty 10

## 2020-10-03 MED ORDER — OXYTOCIN BOLUS FROM INFUSION
333.0000 mL | Freq: Once | INTRAVENOUS | Status: AC
  Administered 2020-10-03: 333 mL via INTRAVENOUS

## 2020-10-03 MED ORDER — WITCH HAZEL-GLYCERIN EX PADS: 1.0000 "application " | MEDICATED_PAD | CUTANEOUS | Status: DC | PRN

## 2020-10-03 MED ORDER — BENZOCAINE-MENTHOL 20-0.5 % EX AERO: 1.0000 "application " | INHALATION_SPRAY | CUTANEOUS | Status: DC | PRN

## 2020-10-03 MED ORDER — ONDANSETRON HCL 4 MG/2ML IJ SOLN: 4.0000 mg | INTRAMUSCULAR | Status: DC | PRN

## 2020-10-03 MED ORDER — ACETAMINOPHEN 325 MG PO TABS
650.0000 mg | ORAL_TABLET | ORAL | Status: DC | PRN
  Administered 2020-10-03: 650 mg via ORAL
  Filled 2020-10-03: qty 2

## 2020-10-03 MED ORDER — ACETAMINOPHEN 325 MG PO TABS
650.0000 mg | ORAL_TABLET | ORAL | Status: DC | PRN
  Administered 2020-10-04: 650 mg via ORAL
  Filled 2020-10-03: qty 2

## 2020-10-03 MED ORDER — PRENATAL MULTIVITAMIN CH
1.0000 | ORAL_TABLET | Freq: Every day | ORAL | Status: DC
  Administered 2020-10-04 – 2020-10-05 (×2): 1 via ORAL
  Filled 2020-10-03 (×2): qty 1

## 2020-10-03 MED ORDER — OXYCODONE HCL 5 MG PO TABS: 10.0000 mg | ORAL_TABLET | ORAL | Status: DC | PRN

## 2020-10-03 MED ORDER — LACTATED RINGERS IV SOLN
500.0000 mL | Freq: Once | INTRAVENOUS | Status: AC
  Administered 2020-10-03: 500 mL via INTRAVENOUS

## 2020-10-03 MED ORDER — SOD CITRATE-CITRIC ACID 500-334 MG/5ML PO SOLN: 30.0000 mL | ORAL | Status: DC | PRN

## 2020-10-03 MED ORDER — COCONUT OIL OIL: 1.0000 "application " | TOPICAL_OIL | Status: DC | PRN

## 2020-10-03 MED ORDER — PHENYLEPHRINE 40 MCG/ML (10ML) SYRINGE FOR IV PUSH (FOR BLOOD PRESSURE SUPPORT): 80.0000 ug | PREFILLED_SYRINGE | INTRAVENOUS | Status: DC | PRN

## 2020-10-03 MED ORDER — ONDANSETRON HCL 4 MG PO TABS: 4.0000 mg | ORAL_TABLET | ORAL | Status: DC | PRN

## 2020-10-03 MED ORDER — IBUPROFEN 600 MG PO TABS
600.0000 mg | ORAL_TABLET | Freq: Four times a day (QID) | ORAL | Status: DC
  Administered 2020-10-04 – 2020-10-05 (×6): 600 mg via ORAL
  Filled 2020-10-03 (×6): qty 1

## 2020-10-03 NOTE — Progress Notes (Signed)
CBG 77 @07 :46  not transferring over to results  

## 2020-10-03 NOTE — Anesthesia Procedure Notes (Signed)
Epidural Patient location during procedure: OB Start time: 10/03/2020 2:15 PM End time: 10/03/2020 2:31 PM  Staffing Anesthesiologist: Trevor Iha, MD Performed: anesthesiologist   Preanesthetic Checklist Completed: patient identified, IV checked, site marked, risks and benefits discussed, surgical consent, monitors and equipment checked, pre-op evaluation and timeout performed  Epidural Patient position: sitting Prep: DuraPrep and site prepped and draped Patient monitoring: continuous pulse ox and blood pressure Approach: midline Location: L2-L3 Injection technique: LOR air  Needle:  Needle type: Tuohy  Needle gauge: 17 G Needle length: 9 cm and 9 Needle insertion depth: 7 cm Catheter type: closed end flexible Catheter size: 19 Gauge Catheter at skin depth: 12 cm Test dose: negative  Assessment Events: blood not aspirated, injection not painful, no injection resistance, no paresthesia and negative IV test  Additional Notes Patient identified. Risks/Benefits/Options discussed with patient including but not limited to bleeding, infection, nerve damage, paralysis, failed block, incomplete pain control, headache, blood pressure changes, nausea, vomiting, reactions to medication both or allergic, itching and postpartum back pain. Confirmed with bedside nurse the patient's most recent platelet count. Confirmed with patient that they are not currently taking any anticoagulation, have any bleeding history or any family history of bleeding disorders. Patient expressed understanding and wished to proceed. All questions were answered. Sterile technique was used throughout the entire procedure. Please see nursing notes for vital signs. Test dose was given through epidural needle and negative prior to continuing to dose epidural or start infusion. Warning signs of high block given to the patient including shortness of breath, tingling/numbness in hands, complete motor block, or any  concerning symptoms with instructions to call for help. Patient was given instructions on fall risk and not to get out of bed. All questions and concerns addressed with instructions to call with any issues.  2 Attempt (S) . Patient tolerated procedure well.

## 2020-10-03 NOTE — H&P (Signed)
Maya Munn is a 25 y.o. female G1P0 [redacted]w[redacted]d presenting for IOL for A2GDM. She reports no LOF, VB, Contractions. Normal FM.   Pregnancy c/b: 1. A2GDM: on metformin 500mg  BID, last growth on 2/25: 5lb11oz (53%) 2. GBS+: on urine culture  OB History    Gravida  1   Para      Term      Preterm      AB      Living        SAB      IAB      Ectopic      Multiple      Live Births             Past Medical History:  Diagnosis Date  . GERD (gastroesophageal reflux disease)   . Gestational diabetes    metformin   Past Surgical History:  Procedure Laterality Date  . NO PAST SURGERIES     Family History: family history includes Healthy in her father and mother. Social History:  reports that she has quit smoking. She has never used smokeless tobacco. She reports previous alcohol use. She reports previous drug use. Drug: Marijuana.     Maternal Diabetes: Yes:  Diabetes Type:  Insulin/Medication controlled Genetic Screening: Normal Maternal Ultrasounds/Referrals: Normal Fetal Ultrasounds or other Referrals:  None Maternal Substance Abuse:  Endorses some marijuana use Significant Maternal Medications:  Metformin Significant Maternal Lab Results:  Group B Strep positive Other Comments:  None  Review of Systems Per HPI Exam Physical Exam  Dilation: 3 Effacement (%): 60 Station: -2 Exam by:: Lexie Ament, RN Blood pressure (!) 144/91, pulse 60, temperature 98 F (36.7 C), temperature source Oral, resp. rate 16, height 5\' 2"  (1.575 m), weight 72.8 kg. NAD, resting comfortably Gravid abdomen, Leopolds 6.5# Fetal testing: FHR 130, moderate variability, + accels, no decels Toco: ctx q 3-4 min Prenatal labs: ABO, Rh:  --/--/O POS (03/29 ) Antibody: NEG (03/29 0752) Rubella: Immune (09/02 0000) RPR: Nonreactive (09/02 0000)  HBsAg: Negative (09/02 0000)  HIV: Non-reactive (09/02 0000)  GBS: Positive/-- (03/01 0000)   Assessment/Plan: 25Y G1P0 @ [redacted]w[redacted]d, IOL  A2GDM 1. IOL: continue pitocin, currently at 60mu/min, titrate PRN. Will plan for AROM after adequate abx for GBS 2. GBS+: continue penicillin 3. Elevated Bps; on admission (none documented in [facility]), will continue to monitor and preeclampsia labs if persistently elevated 4. Epidural upon patient request 5. A2GDM: CBGs q4hr, initial CBG [address]  [facility] 10/03/2020, 9:43 AM

## 2020-10-03 NOTE — Progress Notes (Signed)
S: Patient reports headache resolved after tylenol 650mg  PO. Moderate discomfort with contractions.  O: BP 141/93  SVE 3/70/-1, AROM clear fluid  FHR 130bpm, moderate variability, + accels, no decels Toco: ctx q 2-3 mins  CBC    Component Value Date/Time   WBC 14.0 (H) 10/03/2020 0752   RBC 3.83 (L) 10/03/2020 0752   HGB 10.1 (L) 10/03/2020 0752   HCT 31.8 (L) 10/03/2020 0752   PLT 327 10/03/2020 0752   MCV 83.0 10/03/2020 0752   MCH 26.4 10/03/2020 0752   MCHC 31.8 10/03/2020 0752   RDW 14.8 10/03/2020 0752   LYMPHSABS 0.9 05/29/2018 1211   MONOABS 0.2 05/29/2018 1211   EOSABS 0.0 05/29/2018 1211   BASOSABS 0.1 05/29/2018 1211   CMP     Component Value Date/Time   NA 132 (L) 10/03/2020 1055   K 3.7 10/03/2020 1055   CL 105 10/03/2020 1055   CO2 20 (L) 10/03/2020 1055   GLUCOSE 78 10/03/2020 1055   BUN 5 (L) 10/03/2020 1055   CREATININE 0.75 10/03/2020 1055   CALCIUM 8.4 (L) 10/03/2020 1055   PROT 5.9 (L) 10/03/2020 1055   ALBUMIN 2.5 (L) 10/03/2020 1055   AST 16 10/03/2020 1055   ALT 9 10/03/2020 1055   ALKPHOS 186 (H) 10/03/2020 1055   BILITOT 0.9 10/03/2020 1055   GFRNONAA >60 10/03/2020 1055   GFRAA >60 02/23/2020 1942   Urine p/c 0.47  25Y G1P0 @ [redacted]w[redacted]d, IOL A2GDM 1. IOL: continue pitocin, currently at 52mu/min, titrate PRN. S/p AROM.  2. GBS+: continue penicillin 3. Preeclampsia: now meets criteria given mild range Bps and urine P/C 0.47. Normal plts, LFTs, Cr. Monitor for signs/symptoms of worsening preeclampsia 4. A2GDM: CBGs q4hr, has been euglycemic since admission 5. Epidural upon patient request  M. 9m, MD 10/03/20 1:21 PM

## 2020-10-03 NOTE — Anesthesia Preprocedure Evaluation (Signed)
Anesthesia Evaluation  Patient identified by MRN, date of birth, ID band Patient awake    Reviewed: Allergy & Precautions, NPO status , Patient's Chart, lab work & pertinent test results  Airway Mallampati: II  TM Distance: >3 FB Neck ROM: Full    Dental no notable dental hx. (+) Teeth Intact, Dental Advisory Given   Pulmonary former smoker,    Pulmonary exam normal breath sounds clear to auscultation       Cardiovascular hypertension (gHTN), Normal cardiovascular exam Rhythm:Regular Rate:Normal     Neuro/Psych negative neurological ROS  negative psych ROS   GI/Hepatic Neg liver ROS, GERD  ,  Endo/Other  diabetes, Gestational  Renal/GU negative Renal ROS     Musculoskeletal negative musculoskeletal ROS (+)   Abdominal   Peds  Hematology Lab Results      Component                Value               Date                      WBC                      14.0 (H)            10/03/2020                HGB                      10.1 (L)            10/03/2020                HCT                      31.8 (L)            10/03/2020                MCV                      83.0                10/03/2020                PLT                      327                 10/03/2020              Anesthesia Other Findings   Reproductive/Obstetrics (+) Pregnancy                             Anesthesia Physical Anesthesia Plan  ASA: III  Anesthesia Plan: Epidural   Post-op Pain Management:    Induction:   PONV Risk Score and Plan:   Airway Management Planned:   Additional Equipment:   Intra-op Plan:   Post-operative Plan:   Informed Consent: I have reviewed the patients History and Physical, chart, labs and discussed the procedure including the risks, benefits and alternatives for the proposed anesthesia with the patient or authorized representative who has indicated his/her understanding and  acceptance.       Plan Discussed with: CRNA  Anesthesia Plan Comments: (39.1 wk G1P0 w gdm and gHtn for  LEA)        Anesthesia Quick Evaluation

## 2020-10-04 LAB — CBC
HCT: 28.5 % — ABNORMAL LOW (ref 36.0–46.0)
Hemoglobin: 9 g/dL — ABNORMAL LOW (ref 12.0–15.0)
MCH: 26.5 pg (ref 26.0–34.0)
MCHC: 31.6 g/dL (ref 30.0–36.0)
MCV: 83.8 fL (ref 80.0–100.0)
Platelets: 274 10*3/uL (ref 150–400)
RBC: 3.4 MIL/uL — ABNORMAL LOW (ref 3.87–5.11)
RDW: 14.9 % (ref 11.5–15.5)
WBC: 23.3 10*3/uL — ABNORMAL HIGH (ref 4.0–10.5)
nRBC: 0 % (ref 0.0–0.2)

## 2020-10-04 NOTE — Anesthesia Postprocedure Evaluation (Signed)
Anesthesia Post Note  Patient: Breanna Nguyen  Procedure(s) Performed: AN AD HOC LABOR EPIDURAL     Patient location during evaluation: Mother Baby Anesthesia Type: Epidural Level of consciousness: awake, awake and alert and oriented Pain management: pain level controlled Vital Signs Assessment: post-procedure vital signs reviewed and stable Respiratory status: spontaneous breathing and respiratory function stable Cardiovascular status: blood pressure returned to baseline Postop Assessment: no headache, epidural receding, patient able to bend at knees, adequate PO intake, no backache, no apparent nausea or vomiting and able to ambulate Anesthetic complications: no   No complications documented.  Last Vitals:  Vitals:   10/04/20 0400 10/04/20 0804  BP: 134/85 (!) 128/93  Pulse: 81 63  Resp: 18 16  Temp: 36.8 C   SpO2:      Last Pain:  Vitals:   10/04/20 0400  TempSrc: Oral  PainSc:    Pain Goal:                   Cleda Clarks

## 2020-10-04 NOTE — Progress Notes (Signed)
Patient is doing well.  She is ambulating, voiding, tolerating PO.  Pain control is good.  Lochia is appropriate  Vitals:   10/03/20 2216 10/03/20 2251 10/03/20 2353 10/04/20 0400  BP: (!) 154/93 133/89 (!) 131/91 134/85  Pulse: 66 63 69 81  Resp:  18 18 18   Temp:  98.7 F (37.1 C) 98.6 F (37 C) 98.3 F (36.8 C)  TempSrc:  Oral Oral Oral  SpO2:      Weight:      Height:        NAD Fundus firm Ext: trace edema bilaterally  Lab Results  Component Value Date   WBC 23.3 (H) 10/04/2020   HGB 9.0 (L) 10/04/2020   HCT 28.5 (L) 10/04/2020   MCV 83.8 10/04/2020   PLT 274 10/04/2020    --/--/O POS (03/29 0752)/RImmune  A/P 25 y.o. G1P1001 PPD#1. GDMA2--fasting BG this AM Elevated BPs on admission, ruled in for preeclampsia without severe features.  BPs have been 130s/80s-90s since delivery.  Will monitor today with q4 vital signs and consider PO anti-hypertensives as needed  [facility] GEFFEL Delorus Langwell

## 2020-10-04 NOTE — Clinical Social Work Maternal (Signed)
CLINICAL SOCIAL WORK MATERNAL/CHILD NOTE  Patient Details  Name: Breanna Nguyen MRN: [id number] Date of Birth: 14-Dec-1995  Date:  10/04/2020  Clinical Social Worker Initiating Note:  Kathrin Greathouse, [place] Date/Time: Initiated:  10/04/20/1006     Child's Name:  Breanna Nguyen   Biological Parents:  Mother,Father   Need for Interpreter:  None   Reason for Referral:  Current Substance Use/Substance Use During Pregnancy    Address:  [address] [place] [address]    Phone number:  [phone] (home)     Additional phone number:  Household Members/Support Persons (HM/SP):   Household Member/Support Person 1   HM/SP Name Relationship DOB or Age  HM/SP -1 Katherina Right Significant Other 10-03-1994  HM/SP -2        HM/SP -3        HM/SP -4        HM/SP -5        HM/SP -6        HM/SP -7        HM/SP -8          Natural Supports (not living in the home):  Extended Family   Professional Supports:     Employment: Unemployed   Type of Work:     Education:  9 to 11 years   Homebound arranged: No  Financial Resources:  [employer]   Other Resources:  Physicist, medical ,[place] Considerations Which May Impact Care: None   Strengths:  Ability to meet basic needs ,Home prepared for child ,Pediatrician chosen   Psychotropic Medications:         Pediatrician:    [place]  Pediatrician List:   [place] Other ([facility])  [place]      Pediatrician Fax Number:    Risk Factors/Current Problems:  Substance Use    Cognitive State:  Linear Thinking ,Able to Concentrate    Mood/Affect:  Calm   CSW Assessment:  CSW received consult for substance exposed newborn. CSW met with MOB to offer support and complete assessment.    CSW met with MOB at bedside. CSW introduced role and congratulated MOB. FOB and MOB mother at bedside. CSW notified  MOB about privacy policy/HIPPA and offered privacy. MOB agreeable for FOB and her mother to stay. CSW observed MOB holding and bonding with the infant. CSW asked MOB how she feels since giving birth. MOB reports, "I am really tired." CSW asked MOB about her pregnancy. MOB reports overall it was a good pregnancy, no complications. CSW asked about FOB name and DOB. MOB reports Katherina Right (10[birth year]03-1994) is the FOB. He is also the only member of her household. CSW asked MOB about education and unemployment. MOB reports she completed 9th grade and is currently unemployed. CSW asked MOB if she is receiving WIC and food stamp services. MOB reports she currently received food stamps but must call make an appointment for the [facility] services. MOB reports she has the contact information at home.   CSW asked MOB about any mental health history. MOB reports no mental health history, no medication or therapy. CSW assessed MOB for safety, MOB denies thoughts of suicide and homicide. CSW provided education regarding the baby blues period vs. perinatal mood disorders, discussed treatment and gave resources for mental health follow up if concerns arise.  CSW recommended MOB complete a self-evaluation during the postpartum time period  using the New Mom Checklist from Postpartum Progress and encouraged MOB to contact a medical professional if symptoms are noted at any time. MOB receptive to the resources.   CSW asked MOB if she used substances (marijuana) during her pregnancy. MOB nodded her head to gesture "yes". CSW explain the hospital drug screen policy. CSW explain to MOB if the infant urine or umbilical cord results positive for any substances then a report will be made to Child Protective Services (CPS). CSW notified MOB CPS will then complete their own assessment. MOB stated, ok. CSW asked MOB if she had any additional questions. MOB asked no additional questions.   CSW provided review of Sudden Infant Death Syndrome (SIDS)  precautions and informed MOB no-co sleeping with the infant. MOB reports understanding. CSW asked MOB if she has items for the infant. MOB reports she has items for the infant, including a crib and car seat. MOB has chosen [employer]. CSW assessed for additional needs.    CSW identifies no further need for intervention and no barriers to discharge at this time.  CSW Plan/Description:  Hospital Drug Screen Policy Information,Perinatal Mood and Anxiety Disorder (PMADs) Education,No Further Intervention Required/No Barriers to Discharge,Sudden Infant Death Syndrome (SIDS) Education,CSW Will Continue to Monitor Umbilical Cord Tissue Drug Screen Results and Make Report if [facility] Protective Service Report     Lia Hopping, LCSW 10/04/2020, 10:16 AM

## 2020-10-05 DIAGNOSIS — O1490 Unspecified pre-eclampsia, unspecified trimester: Secondary | ICD-10-CM

## 2020-10-05 LAB — GLUCOSE, CAPILLARY: Glucose-Capillary: 78 mg/dL (ref 70–99)

## 2020-10-05 MED ORDER — NIFEDIPINE ER OSMOTIC RELEASE 30 MG PO TB24
30.0000 mg | ORAL_TABLET | Freq: Every day | ORAL | Status: DC
  Administered 2020-10-05: 30 mg via ORAL
  Filled 2020-10-05: qty 1

## 2020-10-05 MED ORDER — SENNOSIDES-DOCUSATE SODIUM 8.6-50 MG PO TABS: 2.0000 | ORAL_TABLET | Freq: Every day | ORAL | 1 refills | Status: DC

## 2020-10-05 MED ORDER — IBUPROFEN 600 MG PO TABS: 600.0000 mg | ORAL_TABLET | Freq: Four times a day (QID) | ORAL | 1 refills | Status: DC

## 2020-10-05 MED ORDER — ACETAMINOPHEN 325 MG PO TABS: 650.0000 mg | ORAL_TABLET | ORAL | 1 refills | Status: DC | PRN

## 2020-10-05 MED ORDER — NIFEDIPINE ER 30 MG PO TB24: 30.0000 mg | ORAL_TABLET | Freq: Every day | ORAL | 1 refills | Status: DC

## 2020-10-05 NOTE — Discharge Instructions (Signed)
Check your blood pressure two times a day Severe range which might require immediate evaluation is >160/110 If persistently >140/90 call [facility]  Postpartum Care After Vaginal Delivery The following information offers guidance about how to care for yourself from the time you deliver your baby to 6-12 weeks after delivery (postpartum period). If you have problems or questions, contact your health care provider for more specific instructions. Follow these instructions at home: Vaginal bleeding  It is normal to have vaginal bleeding (lochia) after delivery. Wear a sanitary pad for bleeding and discharge. ? During the first week after delivery, the amount and appearance of lochia is often similar to a menstrual period. ? Over the next few weeks, it will gradually decrease to a dry, yellow-brown discharge. ? For most women, lochia stops completely by 4-6 weeks after delivery, but can vary.  Change your sanitary pads frequently. Watch for any changes in your flow, such as: ? A sudden increase in volume. ? A change in color. ? Large blood clots.  If you pass a blood clot from your vagina, save it and call your health care provider. Do not flush blood clots down the toilet before talking with your health care provider.  Do not use tampons or douches until your health care provider approves.  If you are not breastfeeding, your period should return 6-8 weeks after delivery. If you are feeding your baby breast milk only, your period may not return until you stop breastfeeding. Perineal care  Keep the area between the vagina and the anus (perineum) clean and dry. Use medicated pads and pain-relieving sprays and creams as directed.  If you had a surgical cut in the perineum (episiotomy) or a tear, check the area for signs of infection until you are healed. Check for: ? More redness, swelling, or pain. ? Fluid or blood coming from the cut or tear. ? Warmth. ? Pus or a bad smell.  You  may be given a squirt bottle to use instead of wiping to clean the perineum area after you use the bathroom. Pat the area gently to dry it.  To relieve pain caused by an episiotomy, a tear, or swollen veins in the anus (hemorrhoids), take a warm sitz bath 2-3 times a day. In a sitz bath, the warm water should only come up to your hips and cover your buttocks.   Breast care  In the first few days after delivery, your breasts may feel heavy, full, and uncomfortable (breast engorgement). Milk may also leak from your breasts. Ask your health care provider about ways to help relieve the discomfort.  If you are breastfeeding: ? Wear a bra that supports your breasts and fits well. Use breast pads to absorb milk that leaks. ? Keep your nipples clean and dry. Apply creams and ointments as told. ? You may have uterine contractions every time you breastfeed for up to several weeks after delivery. This helps your uterus return to its normal size. ? If you have any problems with breastfeeding, notify your health care provider or lactation consultant.  If you are not breastfeeding: ? Avoid touching your breasts. Do not squeeze out (express) milk. Doing this can make your breasts produce more milk. ? Wear a good-fitting bra and use cold packs to help with swelling. Intimacy and sexuality  Ask your health care provider when you can engage in sexual activity. This may depend upon: ? Your risk of infection. ? How fast you are healing. ? Your comfort and desire  to engage in sexual activity.  You are able to get pregnant after delivery, even if you have not had your period. Talk with your health care provider about methods of birth control (contraception) or family planning if you desire future pregnancies. Medicines  Take over-the-counter and prescription medicines only as told by your health care provider.  Take an over-the-counter stool softener to help ease bowel movements as told by your health care  provider.  If you were prescribed an antibiotic medicine, take it as told by your health care provider. Do not stop taking the antibiotic even if you start to feel better.  Review all previous and current prescriptions to check for possible transfer into breast milk. Activity  Gradually return to your normal activities as told by your health care provider.  Rest as much as possible. Nap while your baby is sleeping. Eating and drinking  Drink enough fluid to keep your urine pale yellow.  To help prevent or relieve constipation, eat high-fiber foods every day.  Choose healthy eating to support breastfeeding or weight loss goals.  Take your prenatal vitamins until your health care provider tells you to stop.   General tips/recommendations  Do not use any products that contain nicotine or tobacco. These products include cigarettes, chewing tobacco, and vaping devices, such as e-cigarettes. If you need help quitting, ask your health care provider.  Do not drink alcohol, especially if you are breastfeeding.  Do not take medications or drugs that are not prescribed to you, especially if you are breastfeeding.  Visit your health care provider for a postpartum checkup within the first 3-6 weeks after delivery.  Complete a comprehensive postpartum visit no later than 12 weeks after delivery.  Keep all follow-up visits for you and your baby. Contact a health care provider if:  You feel unusually sad or worried.  Your breasts become red, painful, or hard.  You have a fever or other signs of an infection.  You have bleeding that is soaking through one pad an hour or you have blood clots.  You have a severe headache that doesn't go away or you have vision changes.  You have nausea and vomiting and are unable to eat or drink anything for 24 hours. Get help right away if:  You have chest pain or difficulty breathing.  You have sudden, severe leg pain.  You faint or have a  seizure.  You have thoughts about hurting yourself or your baby. If you ever feel like you may hurt yourself or others, or have thoughts about taking your own life, get help right away. Go to your nearest emergency department or:  Call your local emergency services (911 in the [place]  The National Suicide Prevention Lifeline at 610-773-1156. This suicide crisis helpline is open 24 hours a day.  Text the Crisis Text Line at (262) 213-9927 (in the [place] Summary  The period of time after you deliver your newborn up to 6-12 weeks after delivery is called the postpartum period.  Keep all follow-up visits for you and your baby.  Review all previous and current prescriptions to check for possible transfer into breast milk.  Contact a health care provider if you feel unusually sad or worried during the postpartum period. This information is not intended to replace advice given to you by your health care provider. Make sure you discuss any questions you have with your health care provider. Document Revised: 03/09/2020 Document Reviewed: 03/09/2020 Elsevier Patient Education  2021 [employer].

## 2020-10-05 NOTE — Discharge Summary (Signed)
Postpartum Discharge Summary  Patient Name: Breanna Nguyen DOB: 1995-09-10 MRN: [id number]  Date of admission: 10/03/2020 Delivery date:10/03/2020  Delivering provider: Irene Pap E  Date of discharge: 10/05/2020  Admitting diagnosis: Gestational diabetes [O24.419] Intrauterine pregnancy: 100w1d    Secondary diagnosis:  Active Problems:   Gestational diabetes   SVD (spontaneous vaginal delivery)   Preeclampsia  Additional problems: none    Discharge diagnosis: Term Pregnancy Delivered                                              Post partum procedures:none Augmentation: AROM and Pitocin Complications: None  Hospital course: Induction of Labor With Vaginal Delivery   25y.o. yo G1P1001 at 321w1das admitted to the hospital 10/03/2020 for induction of labor.  Indication for induction: A2 DM.  Patient had an uncomplicated labor course as follows: Membrane Rupture Time/Date: 1:06 PM ,10/03/2020   Delivery Method:Vaginal, Spontaneous  Episiotomy: None  Lacerations:  Periurethral;1st degree;Perineal  Details of delivery can be found in separate delivery note.  Patient was diagnosed with preeclampsia during L&D course wild mild range BP and UPC 0.47. Postpartum continued to have mild range BP, started on procardia 3055mL QD on PPD#2. Patient instructed to check Bps at home and to follow up for a 1 week BP check. Fasting BG PPD#2 was 78. She otherwise had a routine postpartum course. Patient is discharged home 10/05/20.  Newborn Data: Birth date:10/03/2020  Birth time:8:32 PM  Gender:Female  Living status:Living  Apgars:8 ,9  Weight:3504 g   Magnesium Sulfate received: No BMZ received: No Rhophylac:N/A MMR:N/A T-DaP:Given prenatally Flu: declined Transfusion:No  Physical exam  Vitals:   10/05/20 0839 10/05/20 1027 10/05/20 1205 10/05/20 1337  BP: 133/86 (!) 139/97 132/88 134/90  Pulse: 61  62 63  Resp: 14     Temp: 97.9 F (36.6 C)     TempSrc: Oral     SpO2:       Weight:      Height:       General: alert and no distress Lochia: appropriate Uterine Fundus: firm DVT Evaluation: No evidence of DVT seen on physical exam. Labs: Lab Results  Component Value Date   WBC 23.3 (H) 10/04/2020   HGB 9.0 (L) 10/04/2020   HCT 28.5 (L) 10/04/2020   MCV 83.8 10/04/2020   PLT 274 10/04/2020   CMP Latest Ref Rng & Units 10/03/2020  Glucose 70 - 99 mg/dL 78  BUN 6 - 20 mg/dL 5(L)  Creatinine 0.44 - 1.00 mg/dL 0.75  Sodium 135 - 145 mmol/L 132(L)  Potassium 3.5 - 5.1 mmol/L 3.7  Chloride 98 - 111 mmol/L 105  CO2 22 - 32 mmol/L 20(L)  Calcium 8.9 - 10.3 mg/dL 8.4(L)  Total Protein 6.5 - 8.1 g/dL 5.9(L)  Total Bilirubin 0.3 - 1.2 mg/dL 0.9  Alkaline Phos 38 - 126 U/L 186(H)  AST 15 - 41 U/L 16  ALT 0 - 44 U/L 9   Edinburgh Score: Edinburgh Postnatal Depression Scale Screening Tool 10/05/2020  I have been able to laugh and see the funny side of things. 0  I have looked forward with enjoyment to things. 0  I have blamed myself unnecessarily when things went wrong. 0  I have been anxious or worried for no good reason. 0  I have felt scared or panicky for no good  reason. 0  Things have been getting on top of me. 0  I have been so unhappy that I have had difficulty sleeping. 0  I have felt sad or miserable. 0  I have been so unhappy that I have been crying. 0  The thought of harming myself has occurred to me. 0  Edinburgh Postnatal Depression Scale Total 0      After visit meds:  Allergies as of 10/05/2020   No Known Allergies     Medication List    STOP taking these medications   metFORMIN 500 MG tablet Commonly known as: GLUCOPHAGE     TAKE these medications   acetaminophen 325 MG tablet Commonly known as: Tylenol Take 2 tablets (650 mg total) by mouth every 4 (four) hours as needed (for pain scale < 4).   famotidine 20 MG tablet Commonly known as: PEPCID Take 20 mg by mouth 2 (two) times daily.   hydrocortisone cream 1 % Apply 1  application topically 2 (two) times daily as needed for itching.   ibuprofen 600 MG tablet Commonly known as: ADVIL Take 1 tablet (600 mg total) by mouth every 6 (six) hours.   NIFEdipine 30 MG 24 hr tablet Commonly known as: ADALAT CC Take 1 tablet (30 mg total) by mouth daily. Start taking on: October 06, 2020   omeprazole 20 MG capsule Commonly known as: PRILOSEC Take 20 mg by mouth daily.   prenatal multivitamin Tabs tablet Take 1 tablet by mouth daily at 12 noon.   senna-docusate 8.6-50 MG tablet Commonly known as: Senokot-S Take 2 tablets by mouth daily.        Discharge home in stable condition Infant Feeding: Bottle Infant Disposition:home with mother Discharge instruction: per After Visit Summary and Postpartum booklet. Activity: Advance as tolerated. Pelvic rest for 6 weeks.  Diet: routine diet Anticipated Birth Control: Unsure Postpartum Appointment:1 week Additional Postpartum F/U: 2 hour GTT and BP check 1 week Future Appointments:No future appointments. Follow up Visit:  Follow-up Information    Rowland Lathe, MD. Schedule an appointment as soon as possible for a visit in 1 week(s).   Specialty: Obstetrics and Gynecology Why: Make a 1 week blood pressure check visit You will also need a postpartum visit around 4 weeks with 2 hour glucose tolerance test Contact information: [address] [place] [address] [phone]                   10/05/2020 Jonelle Sidle, MD

## 2020-10-05 NOTE — Progress Notes (Signed)
Post Partum Day 2 Subjective: no complaints, up ad lib, voiding, tolerating PO and denies HA, VC, RUQ pain, SOB  Objective: Patient Vitals for the past 24 hrs:  BP Temp Temp src Pulse Resp SpO2  10/05/20 0500 136/84 98 F (36.7 C) Oral (!) 56 18 --  10/05/20 0200 125/74 -- -- -- -- --  10/04/20 2200 117/75 -- -- -- -- --  10/04/20 2120 (!) 140/99 98 F (36.7 C) Oral 66 16 99 %  10/04/20 1800 127/90 (!) 97.4 F (36.3 C) Oral (!) 58 17 100 %  10/04/20 1100 119/78 98.6 F (37 C) -- 88 16 --    Physical Exam:  General: alert and no distress Lochia: appropriate Uterine Fundus: firm DVT Evaluation: No evidence of DVT seen on physical exam.  Recent Labs    10/03/20 0752 10/04/20 0504  WBC 14.0* 23.3*  HGB 10.1* 9.0*  HCT 31.8* 28.5*  PLT 327 274    Recent Labs    10/03/20 1055  NA 132*  K 3.7  CL 105  BUN 5*  CREATININE 0.75  GLUCOSE 78  BILITOT 0.9  ALT 9  AST 16  ALKPHOS 186*  PROT 5.9*  ALBUMIN 2.5*    Recent Labs    10/03/20 1055  CALCIUM 8.4*   Assessment/Plan: Breanna Nguyen 25 y.o. G1P1001 PPD#2 sp SVD at [redacted]w[redacted]d  1. PPC: bilateral periurethral lacerations and first degree perineal laceration repaired. EBL 200cc. Hgb 10.1>9 2. GDMA2: fasting BG 78, discussed 2h GTT at postpartum visit 3. Preeclampsia: elevated BP and UPC 0.47, CBC/CMP wnl. Postpartum mostly normotensive, check 2 more BP this AM and will determine if she needs home with BP meds 4. Vaccines: s/p tdap in pregnancy. Declines COVID, flu vaccines Rubella immune, blood type O+, bottlefeeding, baby girl in room   LOS: 2 days   Breanna Nguyen 10/05/2020, 8:20 AM

## 2020-10-06 ENCOUNTER — Telehealth: Payer: Self-pay

## 2020-10-06 NOTE — Telephone Encounter (Signed)
Transition Care Management Follow-up Telephone Call  Date of discharge and from where: 10/05/2020 from [facility]  How have you been since you were released from the hospital? Pt stated that she is feeling well.   Any questions or concerns? No  Items Reviewed:  Did the pt receive and understand the discharge instructions provided? Yes   Medications obtained and verified? Yes   Other? No   Any new allergies since your discharge? No   Dietary orders reviewed? n/a  Do you have support at home? Yes   Functional Questionnaire: (I = Independent and D = Dependent) ADLs: I  Bathing/Dressing- I  Meal Prep- I  Eating- I  Maintaining continence- I  Transferring/Ambulation- I  Managing Meds- I   Follow up appointments reviewed:   Specialist Hospital f/u appt confirmed? No  Pt stated that she does have an appointment but I did not see it scheduled in the chart.   Are transportation arrangements needed? No   If their condition worsens, is the pt aware to call PCP or go to the Emergency Dept.? Yes  Was the patient provided with contact information for the PCP's office or ED? Yes  Was to pt encouraged to call back with questions or concerns? Yes

## 2021-04-18 ENCOUNTER — Other Ambulatory Visit: Payer: Self-pay

## 2021-04-18 ENCOUNTER — Encounter: Payer: Self-pay | Admitting: Nurse Practitioner

## 2021-04-18 ENCOUNTER — Ambulatory Visit (INDEPENDENT_AMBULATORY_CARE_PROVIDER_SITE_OTHER): Payer: Medicaid Other | Admitting: Nurse Practitioner

## 2021-04-18 VITALS — BP 96/58 | HR 64 | Temp 98.0°F | Ht 62.0 in | Wt 155.4 lb

## 2021-04-18 DIAGNOSIS — L309 Dermatitis, unspecified: Secondary | ICD-10-CM | POA: Insufficient documentation

## 2021-04-18 DIAGNOSIS — R21 Rash and other nonspecific skin eruption: Secondary | ICD-10-CM

## 2021-04-18 DIAGNOSIS — L308 Other specified dermatitis: Secondary | ICD-10-CM

## 2021-04-18 DIAGNOSIS — Z7689 Persons encountering health services in other specified circumstances: Secondary | ICD-10-CM | POA: Diagnosis not present

## 2021-04-18 MED ORDER — CLOBETASOL PROPIONATE 0.05 % EX CREA: 1.0000 "application " | TOPICAL_CREAM | Freq: Two times a day (BID) | CUTANEOUS | 2 refills | Status: DC

## 2021-04-18 MED ORDER — DOXYCYCLINE HYCLATE 100 MG PO TABS: 100.0000 mg | ORAL_TABLET | Freq: Two times a day (BID) | ORAL | 0 refills | Status: DC

## 2021-04-18 NOTE — Progress Notes (Signed)
New Patient Office Visit  Subjective:  Patient ID: Breanna Nguyen, female    DOB: [birth year]  Age: 25 y.o. MRN: [id number]  CC:  Chief Complaint  Patient presents with   New Patient (Initial Visit)    HPI Breanna Nguyen presents to establish primary care provider. She does see OB/GYN at [facility]. Will needs to get progress notes and labs results from that practice.  The patient had her first baby 10/02/2020.  She did have gestational diabetes during her pregnancy.  She has normal vaginal birth.  Did see OB/GYN provider for 6-week follow-up.  Since her pregnancy, she has noted worsening eczema. She has had this all of her life. Has been able to use OTC hydrocortisone cream. This is first time this has stopped really working for her. Has started noticing some breaks in the skin. She has also noted bumps which, when scratches the bumps, she notes some drainage which can be clear and sometimes a brownish gold color. Sometimes the cracks in the skin will bleed as well.  She denies other problems or concerns at this time.  She denies chest pain, chest pressure, or shortness of breath. She denies headaches or visual disturbances. She denies abdominal pain, nausea, vomiting, or changes in bowel or bladder habits.     Past Medical History:  Diagnosis Date   GERD (gastroesophageal reflux disease)    Gestational diabetes    metformin    Past Surgical History:  Procedure Laterality Date   NO PAST SURGERIES      Family History  Problem Relation Age of Onset   Healthy Mother    High Cholesterol Mother    Healthy Father    High Cholesterol Maternal Grandmother     Social History   Socioeconomic History   Marital status: Single    Spouse name: Not on file   Number of children: Not on file   Years of education: Not on file   Highest education level: Not on file  Occupational History   Not on file  Tobacco Use   Smoking status: Every Day    Packs/day: 1.00    Types:  Cigarettes   Smokeless tobacco: Never   Tobacco comments:    stopped with preg  Vaping Use   Vaping Use: Former  Substance and Sexual Activity   Alcohol use: Yes   Drug use: Not Currently    Types: Marijuana    Comment: Last Use December 2021   Sexual activity: Yes  Other Topics Concern   Not on file  Social History Narrative   Not on file   Social Determinants of Health   Financial Resource Strain: Not on file  Food Insecurity: Not on file  Transportation Needs: Not on file  Physical Activity: Not on file  Stress: Not on file  Social Connections: Not on file  Intimate Partner Violence: Not on file    ROS Review of Systems  Constitutional:  Positive for activity change. Negative for appetite change, chills, fatigue and fever.       Patient states with the severity of eczema on her hands and fingers, she is unable to wash dishes or even put her fingers in water.  She states that given her daughter a bath is nearly impossible.  Has to have help from family members.  Unable to wear gloves as this makes the eczema worse.  HENT:  Negative for congestion, postnasal drip, rhinorrhea, sinus pressure, sinus pain, sneezing and sore throat.   Eyes: Negative.  Respiratory:  Negative for cough, chest tightness, shortness of breath and wheezing.   Cardiovascular:  Negative for chest pain and palpitations.  Gastrointestinal:  Negative for abdominal pain, constipation, diarrhea, nausea and vomiting.  Endocrine: Negative for cold intolerance, heat intolerance, polydipsia and polyuria.  Genitourinary:  Negative for dyspareunia, dysuria, flank pain, frequency and urgency.  Musculoskeletal:  Negative for arthralgias, back pain and myalgias.  Skin:  Positive for rash.       The patient reports severe and worsening eczema, especially in her hands and fingers.  Tips of fingers feel tight and swollen.  Patches of eczema drain clear to light brown fluid.  Lesions are very itchy.   Allergic/Immunologic: Negative for environmental allergies.  Neurological:  Negative for dizziness, weakness and headaches.  Hematological:  Negative for adenopathy.  Psychiatric/Behavioral:  The patient is not nervous/anxious.    Objective:   Today's Vitals   04/18/21 1129  BP: (!) 96/58  Pulse: 64  Temp: 98 F (36.7 C)  SpO2: 99%  Weight: 155 lb 6.4 oz (70.5 kg)  Height: 5\' 2"  (1.575 m)   Body mass index is 28.42 kg/m.   Physical Exam Vitals and nursing note reviewed.  Constitutional:      Appearance: Normal appearance. She is well-developed.  HENT:     Head: Normocephalic and atraumatic.  Eyes:     Pupils: Pupils are equal, round, and reactive to light.  Cardiovascular:     Rate and Rhythm: Normal rate and regular rhythm.     Pulses: Normal pulses.     Heart sounds: Normal heart sounds.  Pulmonary:     Effort: Pulmonary effort is normal.     Breath sounds: Normal breath sounds.  Abdominal:     Palpations: Abdomen is soft.  Musculoskeletal:        General: Normal range of motion.     Cervical back: Normal range of motion and neck supple.  Lymphadenopathy:     Cervical: No cervical adenopathy.  Skin:    General: Skin is warm and dry.     Capillary Refill: Capillary refill takes less than 2 seconds.     Comments: Skin of hands, fingers, pads of the fingers very dry, rough, and tender to touch.  Pads of the fingers are edematous.  Skin is cracked.  Currently skin is intact with no drainage.  There are similar lesions on the forearms, the back, and the tops of the legs.  Neurological:     General: No focal deficit present.     Mental Status: She is alert and oriented to person, place, and time.  Psychiatric:        Mood and Affect: Mood normal.        Behavior: Behavior normal.        Thought Content: Thought content normal.        Judgment: Judgment normal.    Assessment & Plan:  1. Encounter to establish care Appointment today to establish new primary care  provider.  We will get most recent progress notes and labs from [facility] to review and update patient chart.  2. Other eczema And clobetasol cream.  Apply topically to affected areas twice daily as needed.  Reassess in 2 weeks.  Consider autoimmune, connective tissue labs for further evaluation and referral to dermatology and/or rheumatology as indicated. - clobetasol cream (TEMOVATE) 0.05 %; Apply 1 application topically 2 (two) times daily.  Dispense: 60 g; Refill: 2  3. Rash and nonspecific skin eruption Start  doxycycline 100 mg tablets twice daily for next 2 weeks.  Reassess condition in 2 weeks. - doxycycline (VIBRA-TABS) 100 MG tablet; Take 1 tablet (100 mg total) by mouth 2 (two) times daily.  Dispense: 28 tablet; Refill: 0   Problem List Items Addressed This Visit       Musculoskeletal and Integument   Eczema   Relevant Medications   clobetasol cream (TEMOVATE) 0.05 %   Rash and nonspecific skin eruption   Relevant Medications   doxycycline (VIBRA-TABS) 100 MG tablet     Other   Encounter to establish care - Primary    Outpatient Encounter Medications as of 04/18/2021  Medication Sig   clobetasol cream (TEMOVATE) 0.05 % Apply 1 application topically 2 (two) times daily.   doxycycline (VIBRA-TABS) 100 MG tablet Take 1 tablet (100 mg total) by mouth 2 (two) times daily.   acetaminophen (TYLENOL) 325 MG tablet Take 2 tablets (650 mg total) by mouth every 4 (four) hours as needed (for pain scale < 4). (Patient not taking: Reported on 04/18/2021)   famotidine (PEPCID) 20 MG tablet Take 20 mg by mouth 2 (two) times daily. (Patient not taking: Reported on 04/18/2021)   hydrocortisone cream 1 % Apply 1 application topically 2 (two) times daily as needed for itching. (Patient not taking: Reported on 04/18/2021)   ibuprofen (ADVIL) 600 MG tablet Take 1 tablet (600 mg total) by mouth every 6 (six) hours. (Patient not taking: Reported on 04/18/2021)   NIFEdipine (ADALAT CC) 30  MG 24 hr tablet Take 1 tablet (30 mg total) by mouth daily. (Patient not taking: Reported on 04/18/2021)   omeprazole (PRILOSEC) 20 MG capsule Take 20 mg by mouth daily. (Patient not taking: Reported on 04/18/2021)   Prenatal Vit-Fe Fumarate-FA (PRENATAL MULTIVITAMIN) TABS tablet Take 1 tablet by mouth daily at 12 noon. (Patient not taking: Reported on 04/18/2021)   senna-docusate (SENOKOT-S) 8.6-50 MG tablet Take 2 tablets by mouth daily. (Patient not taking: Reported on 04/18/2021)   No facility-administered encounter medications on file as of 04/18/2021.   This note was dictated using Conservation officer, historic buildings. Rapid proofreading was performed to expedite the delivery of the information. Despite proofreading, phonetic errors will occur which are common with this voice recognition software. Please take this into consideration. If there are any concerns, please contact our office.    Follow-up: Return in about 2 weeks (around 05/02/2021) for we need to get medical records from [facility] along with labs .   Carlean Jews, NP

## 2021-05-02 ENCOUNTER — Ambulatory Visit (INDEPENDENT_AMBULATORY_CARE_PROVIDER_SITE_OTHER): Payer: Medicaid Other | Admitting: Nurse Practitioner

## 2021-05-02 ENCOUNTER — Other Ambulatory Visit: Payer: Self-pay

## 2021-05-02 ENCOUNTER — Encounter: Payer: Self-pay | Admitting: Nurse Practitioner

## 2021-05-02 VITALS — BP 98/62 | HR 45 | Temp 97.9°F | Ht 62.0 in | Wt 156.5 lb

## 2021-05-02 DIAGNOSIS — L309 Dermatitis, unspecified: Secondary | ICD-10-CM

## 2021-05-02 DIAGNOSIS — R21 Rash and other nonspecific skin eruption: Secondary | ICD-10-CM

## 2021-05-02 NOTE — Progress Notes (Signed)
Established Patient Office Visit  Subjective:  Patient ID: Breanna Nguyen, female    DOB: [birth year]  Age: 25 y.o. MRN: [id number]  CC:  Chief Complaint  Patient presents with   Follow-up    HPI Breanna Nguyen presents for follow up visit. Was treated at first visit for severe eczema on hands, pads of fingers, forearms, back, and chest. Was started on doxycycline twice daily and is now using clobetasol cream twice daily as needed. She reports near resolution of symptoms. Is taking doxycycline only once daily as taking it twice daily makes her very nauseated.  She does see provider at [facility]. We do still need to get records for review and to update her chart.   Past Medical History:  Diagnosis Date   GERD (gastroesophageal reflux disease)    Gestational diabetes    metformin    Past Surgical History:  Procedure Laterality Date   NO PAST SURGERIES      Family History  Problem Relation Age of Onset   Healthy Mother    High Cholesterol Mother    Healthy Father    High Cholesterol Maternal Grandmother     Social History   Socioeconomic History   Marital status: Single    Spouse name: Not on file   Number of children: Not on file   Years of education: Not on file   Highest education level: Not on file  Occupational History   Not on file  Tobacco Use   Smoking status: Every Day    Packs/day: 1.00    Types: Cigarettes   Smokeless tobacco: Never   Tobacco comments:    stopped with preg  Vaping Use   Vaping Use: Former  Substance and Sexual Activity   Alcohol use: Yes   Drug use: Not Currently    Types: Marijuana    Comment: Last Use December 2021   Sexual activity: Yes  Other Topics Concern   Not on file  Social History Narrative   Not on file   Social Determinants of Health   Financial Resource Strain: Not on file  Food Insecurity: Not on file  Transportation Needs: Not on file  Physical Activity: Not on file  Stress: Not on file  Social  Connections: Not on file  Intimate Partner Violence: Not on file    Outpatient Medications Prior to Visit  Medication Sig Dispense Refill   clobetasol cream (TEMOVATE) 0.05 % Apply 1 application topically 2 (two) times daily. 60 g 2   doxycycline (VIBRA-TABS) 100 MG tablet Take 1 tablet (100 mg total) by mouth 2 (two) times daily. 28 tablet 0   hydrocortisone cream 1 % Apply 1 application topically 2 (two) times daily as needed for itching.     acetaminophen (TYLENOL) 325 MG tablet Take 2 tablets (650 mg total) by mouth every 4 (four) hours as needed (for pain scale < 4). (Patient not taking: Reported on 04/18/2021) 30 tablet 1   famotidine (PEPCID) 20 MG tablet Take 20 mg by mouth 2 (two) times daily. (Patient not taking: Reported on 04/18/2021)     ibuprofen (ADVIL) 600 MG tablet Take 1 tablet (600 mg total) by mouth every 6 (six) hours. (Patient not taking: Reported on 04/18/2021) 30 tablet 1   NIFEdipine (ADALAT CC) 30 MG 24 hr tablet Take 1 tablet (30 mg total) by mouth daily. (Patient not taking: Reported on 04/18/2021) 30 tablet 1   omeprazole (PRILOSEC) 20 MG capsule Take 20 mg by mouth daily. (Patient not  taking: Reported on 04/18/2021)     Prenatal Vit-Fe Fumarate-FA (PRENATAL MULTIVITAMIN) TABS tablet Take 1 tablet by mouth daily at 12 noon. (Patient not taking: Reported on 04/18/2021)     senna-docusate (SENOKOT-S) 8.6-50 MG tablet Take 2 tablets by mouth daily. (Patient not taking: Reported on 04/18/2021) 30 tablet 1   No facility-administered medications prior to visit.    No Known Allergies  ROS Review of Systems  Constitutional:  Negative for activity change, appetite change, chills, fatigue and fever.  HENT:  Negative for congestion, postnasal drip, rhinorrhea, sinus pressure, sinus pain, sneezing and sore throat.   Eyes: Negative.   Respiratory:  Negative for cough, chest tightness, shortness of breath and wheezing.   Cardiovascular:  Negative for chest pain and  palpitations.  Gastrointestinal:  Negative for abdominal pain, constipation, diarrhea, nausea and vomiting.  Endocrine: Negative for cold intolerance, heat intolerance, polydipsia and polyuria.  Genitourinary:  Negative for dyspareunia, dysuria, flank pain, frequency and urgency.  Musculoskeletal:  Negative for arthralgias, back pain and myalgias.  Skin:  Negative for rash.       Patinet reporting significantly improved eczema on fingers, hands, and forearms. No longer tender. Able to perform routine activities such as washing dishes and bathing her child. She was unable to do these activities at her most recent visit.   Allergic/Immunologic: Negative for environmental allergies.  Neurological:  Negative for dizziness, weakness and headaches.  Hematological:  Negative for adenopathy.  Psychiatric/Behavioral:  The patient is not nervous/anxious.      Objective:    Physical Exam Vitals and nursing note reviewed.  Constitutional:      Appearance: Normal appearance. She is well-developed.  HENT:     Head: Normocephalic and atraumatic.     Nose: Nose normal.     Mouth/Throat:     Mouth: Mucous membranes are moist.  Eyes:     Extraocular Movements: Extraocular movements intact.     Conjunctiva/sclera: Conjunctivae normal.     Pupils: Pupils are equal, round, and reactive to light.  Cardiovascular:     Rate and Rhythm: Normal rate and regular rhythm.     Pulses: Normal pulses.     Heart sounds: Normal heart sounds.  Pulmonary:     Effort: Pulmonary effort is normal.     Breath sounds: Normal breath sounds.  Abdominal:     Palpations: Abdomen is soft.  Musculoskeletal:        General: Normal range of motion.     Cervical back: Normal range of motion and neck supple.  Lymphadenopathy:     Cervical: No cervical adenopathy.  Skin:    General: Skin is warm and dry.     Capillary Refill: Capillary refill takes less than 2 seconds.     Comments: Some rough skin on pads of index and  middle fingers of left hand and middle finger of right hand. No longer tender to palpate. No redness or erythema present. No lesions present on palms or dorsal surfaces of hands. No lesions on forearms. Skin intact   Neurological:     General: No focal deficit present.     Mental Status: She is alert and oriented to person, place, and time.  Psychiatric:        Mood and Affect: Mood normal.        Behavior: Behavior normal.        Thought Content: Thought content normal.        Judgment: Judgment normal.    Today's Vitals  05/02/21 0932  BP: 98/62  Pulse: (!) 45  Temp: 97.9 F (36.6 C)  SpO2: 99%  Weight: 156 lb 8 oz (71 kg)   Body mass index is 28.62 kg/m.   Wt Readings from Last 3 Encounters:  05/02/21 156 lb 8 oz (71 kg)  04/18/21 155 lb 6.4 oz (70.5 kg)  10/03/20 160 lb 8 oz (72.8 kg)     Health Maintenance Due  Topic Date Due   COVID-19 Vaccine (1) Never done   Pneumococcal Vaccine 55-58 Years old (1 - PCV) Never done   TETANUS/TDAP  Never done   PAP-Cervical Cytology Screening  Never done   PAP SMEAR-Modifier  Never done    There are no preventive care reminders to display for this patient.   No results found for: TSH Lab Results  Component Value Date   WBC 23.3 (H) 10/04/2020   HGB 9.0 (L) 10/04/2020   HCT 28.5 (L) 10/04/2020   MCV 83.8 10/04/2020   PLT 274 10/04/2020   Lab Results  Component Value Date   NA 132 (L) 10/03/2020   K 3.7 10/03/2020   CO2 20 (L) 10/03/2020   GLUCOSE 78 10/03/2020   BUN 5 (L) 10/03/2020   CREATININE 0.75 10/03/2020   BILITOT 0.9 10/03/2020   ALKPHOS 186 (H) 10/03/2020   AST 16 10/03/2020   ALT 9 10/03/2020   PROT 5.9 (L) 10/03/2020   ALBUMIN 2.5 (L) 10/03/2020   CALCIUM 8.4 (L) 10/03/2020   ANIONGAP 7 10/03/2020   No results found for: CHOL No results found for: HDL No results found for: LDLCALC No results found for: TRIG No results found for: CHOLHDL No results found for: YSAY3K    Assessment & Plan:   1. Rash and nonspecific skin eruption Improving. Continue to take doxycycline 100mg  daily until prescription is gone. Advised patient to notify the office if symptoms begin to worsen again. Will restart antibiotic therapy and consider referral to dermatology. Patient voiced understanding and agreement with this plan.   2. Eczema, unspecified type Improving. Continue to use clobetasol topically as needed and as prescribed    Problem List Items Addressed This Visit       Musculoskeletal and Integument   Eczema   Rash and nonspecific skin eruption - Primary      Follow-up: Return in about 2 months (around 07/02/2021) for health maintenance exam, with pap  - check CBC, CMP, TSH, Free T4, Fasting lipids, HgbA1c, at visit .    07/04/2021, NP

## 2021-07-03 ENCOUNTER — Encounter: Payer: Self-pay | Admitting: Nurse Practitioner

## 2021-07-03 ENCOUNTER — Other Ambulatory Visit: Payer: Self-pay

## 2021-07-03 ENCOUNTER — Ambulatory Visit (INDEPENDENT_AMBULATORY_CARE_PROVIDER_SITE_OTHER): Payer: Medicaid Other | Admitting: Nurse Practitioner

## 2021-07-03 ENCOUNTER — Other Ambulatory Visit (HOSPITAL_COMMUNITY)
Admission: RE | Admit: 2021-07-03 | Discharge: 2021-07-03 | Disposition: A | Discharge status: Home / Self Care | Payer: Medicaid Other | Source: Ambulatory Visit | Attending: Nurse Practitioner | Admitting: Nurse Practitioner

## 2021-07-03 VITALS — BP 105/70 | HR 52 | Temp 98.4°F | Ht 62.0 in | Wt 153.7 lb

## 2021-07-03 DIAGNOSIS — Z202 Contact with and (suspected) exposure to infections with a predominantly sexual mode of transmission: Secondary | ICD-10-CM

## 2021-07-03 DIAGNOSIS — Z01419 Encounter for gynecological examination (general) (routine) without abnormal findings: Secondary | ICD-10-CM

## 2021-07-03 DIAGNOSIS — Z23 Encounter for immunization: Secondary | ICD-10-CM

## 2021-07-03 DIAGNOSIS — Z0000 Encounter for general adult medical examination without abnormal findings: Secondary | ICD-10-CM

## 2021-07-03 DIAGNOSIS — E663 Overweight: Secondary | ICD-10-CM

## 2021-07-03 DIAGNOSIS — R5383 Other fatigue: Secondary | ICD-10-CM | POA: Insufficient documentation

## 2021-07-03 DIAGNOSIS — N898 Other specified noninflammatory disorders of vagina: Secondary | ICD-10-CM | POA: Diagnosis not present

## 2021-07-03 NOTE — Progress Notes (Signed)
° °Established Patient Office Visit ° °Subjective:  °Patient ID: Breanna Nguyen, female    DOB: 03/03/1996  Age: 25 y.o. MRN: [id number] ° °CC:  °Chief Complaint  °Patient presents with  ° Annual Exam  ° Gynecologic Exam  ° ° °HPI °Breanna Nguyen presents for wellness visit and well woman exam. She states that she has been having intermittent vaginal discharge which causes her to have vaginal pain and sometimes pain with urination. She states that it comes and goes and has been a problems for her since she was pregnant with her daughter. Her daughter is now 9 months old. She states that she was pregnant with her daughter, had initially been treated with amoxicillin for uti. Then ended up she had vaginal yeast infection. Was treated with monistat. She states that symptoms improved a little after this, but then came righr back. States that she intermittently has pelvic pain along with the discharge. Unable to think of anything that makes the problem better or worse.  ° °Past Medical History:  °Diagnosis Date  ° GERD (gastroesophageal reflux disease)   ° Gestational diabetes   ° metformin  ° ° °Past Surgical History:  °Procedure Laterality Date  ° NO PAST SURGERIES    ° ° °Family History  °Problem Relation Age of Onset  ° Healthy Mother   ° High Cholesterol Mother   ° Healthy Father   ° High Cholesterol Maternal Grandmother   ° ° °Social History  ° °Socioeconomic History  ° Marital status: Single  °  Spouse name: Not on file  ° Number of children: Not on file  ° Years of education: Not on file  ° Highest education level: Not on file  °Occupational History  ° Not on file  °Tobacco Use  ° Smoking status: Every Day  °  Packs/day: 1.00  °  Types: Cigarettes  ° Smokeless tobacco: Never  ° Tobacco comments:  °  stopped with preg  °Vaping Use  ° Vaping Use: Former  °Substance and Sexual Activity  ° Alcohol use: Yes  ° Drug use: Not Currently  °  Types: Marijuana  °  Comment: Last Use December 2021  ° Sexual activity: Yes   °Other Topics Concern  ° Not on file  °Social History Narrative  ° Not on file  ° °Social Determinants of Health  ° °Financial Resource Strain: Not on file  °Food Insecurity: Not on file  °Transportation Needs: Not on file  °Physical Activity: Not on file  °Stress: Not on file  °Social Connections: Not on file  °Intimate Partner Violence: Not on file  ° ° °Outpatient Medications Prior to Visit  °Medication Sig Dispense Refill  ° doxycycline (VIBRA-TABS) 100 MG tablet Take 1 tablet (100 mg total) by mouth 2 (two) times daily. 28 tablet 0  ° clobetasol cream (TEMOVATE) 0.05 % Apply 1 application topically 2 (two) times daily. (Patient not taking: Reported on 07/03/2021) 60 g 2  ° °No facility-administered medications prior to visit.  ° ° °No Known Allergies ° °ROS °Review of Systems  °Constitutional:  Positive for fatigue. Negative for activity change, appetite change, chills and fever.  °HENT:  Negative for congestion, postnasal drip, rhinorrhea, sinus pressure, sinus pain, sneezing and sore throat.   °Eyes: Negative.   °Respiratory:  Negative for cough, chest tightness, shortness of breath and wheezing.   °Cardiovascular:  Negative for chest pain and palpitations.  °Gastrointestinal:  Positive for nausea. Negative for abdominal pain, constipation, diarrhea and vomiting.  °Endocrine: Negative for   for cold intolerance, heat intolerance, polydipsia and polyuria.  Genitourinary:  Positive for pelvic pain, vaginal discharge and vaginal pain. Negative for dyspareunia, dysuria, flank pain, frequency and urgency.  Musculoskeletal:  Negative for arthralgias, back pain and myalgias.  Skin:  Negative for rash.  Allergic/Immunologic: Negative for environmental allergies.  Neurological:  Negative for dizziness, weakness and headaches.  Hematological:  Negative for adenopathy.  Psychiatric/Behavioral:  The patient is not nervous/anxious.      Objective:    Physical Exam Vitals and nursing note reviewed. Exam conducted  with a chaperone present.  Constitutional:      Appearance: Normal appearance. She is well-developed.  HENT:     Head: Normocephalic and atraumatic.     Right Ear: Tympanic membrane, ear canal and external ear normal.     Left Ear: Tympanic membrane, ear canal and external ear normal.     Nose: Nose normal.     Mouth/Throat:     Mouth: Mucous membranes are moist.     Pharynx: Oropharynx is clear.  Eyes:     Extraocular Movements: Extraocular movements intact.     Conjunctiva/sclera: Conjunctivae normal.     Pupils: Pupils are equal, round, and reactive to light.  Cardiovascular:     Rate and Rhythm: Normal rate and regular rhythm.     Pulses: Normal pulses.     Heart sounds: Normal heart sounds.  Pulmonary:     Effort: Pulmonary effort is normal.     Breath sounds: Normal breath sounds.  Chest:  Breasts:    Right: Normal. No swelling, bleeding, inverted nipple, mass, nipple discharge, skin change or tenderness.     Left: Normal. No swelling, bleeding, inverted nipple, mass, nipple discharge, skin change or tenderness.  Abdominal:     General: Bowel sounds are normal. There is no distension.     Palpations: Abdomen is soft. There is no mass.     Tenderness: There is no abdominal tenderness. There is no guarding or rebound.     Hernia: No hernia is present. There is no hernia in the left inguinal area or right inguinal area.  Genitourinary:    General: Normal vulva.     Exam position: Supine.     Labia:        Right: No rash, tenderness, lesion or injury.        Left: No rash, tenderness, lesion or injury.      Vagina: No foreign body. Bleeding present. No vaginal discharge, erythema, tenderness, lesions or prolapsed vaginal walls.     Cervix: Cervical motion tenderness, discharge and cervical bleeding present. No friability, erythema or eversion.     Uterus: Normal.      Adnexa: Right adnexa normal and left adnexa normal.     Comments: No tenderness, masses, or organomeglay  present during bimanual exam .  The patient is currently on her menstrual cycle.  Musculoskeletal:        General: Normal range of motion.     Cervical back: Normal range of motion and neck supple.  Lymphadenopathy:     Cervical: No cervical adenopathy.     Upper Body:     Right upper body: No axillary adenopathy.     Left upper body: No axillary adenopathy.     Lower Body: No right inguinal adenopathy. No left inguinal adenopathy.  Skin:    General: Skin is warm and dry.     Capillary Refill: Capillary refill takes less than 2 seconds.  Neurological:  No focal deficit present.  °   Mental Status: She is alert and oriented to person, place, and time.  °Psychiatric:     °   Mood and Affect: Mood normal.     °   Behavior: Behavior normal.     °   Thought Content: Thought content normal.     °   Judgment: Judgment normal.  ° ° °Today's Vitals  ° 07/03/21 1123 07/03/21 1221  °BP: (!) 98/57 105/70  °Pulse: (!) 52   °Temp: 98.4 °F (36.9 °C)   °SpO2: 100%   °Weight: 153 lb 11.2 oz (69.7 kg)   °Height: 5' 2" (1.575 m)   ° °Body mass index is 28.11 kg/m².  ° °Wt Readings from Last 3 Encounters:  °07/03/21 153 lb 11.2 oz (69.7 kg)  °05/02/21 156 lb 8 oz (71 kg)  °04/18/21 155 lb 6.4 oz (70.5 kg)  ° ° ° °Health Maintenance Due  °Topic Date Due  ° COVID-19 Vaccine (1) Never done  ° Pneumococcal Vaccine 19-64 Years old (1 - PCV) Never done  ° PAP-Cervical Cytology Screening  Never done  ° PAP SMEAR-Modifier  Never done  ° ° °There are no preventive care reminders to display for this patient. ° °No results found for: TSH °Lab Results  °Component Value Date  ° WBC 23.3 (H) 10/04/2020  ° HGB 9.0 (L) 10/04/2020  ° HCT 28.5 (L) 10/04/2020  ° MCV 83.8 10/04/2020  ° PLT 274 10/04/2020  ° °Lab Results  °Component Value Date  ° NA 132 (L) 10/03/2020  ° K 3.7 10/03/2020  ° CO2 20 (L) 10/03/2020  ° GLUCOSE 78 10/03/2020  ° BUN 5 (L) 10/03/2020  ° CREATININE 0.75 10/03/2020  ° BILITOT 0.9 10/03/2020  ° ALKPHOS 186  (H) 10/03/2020  ° AST 16 10/03/2020  ° ALT 9 10/03/2020  ° PROT 5.9 (L) 10/03/2020  ° ALBUMIN 2.5 (L) 10/03/2020  ° CALCIUM 8.4 (L) 10/03/2020  ° ANIONGAP 7 10/03/2020  ° °  °Assessment & Plan:  °1. Well woman exam °Today with Pap smear obtained during today's visit. °- Cytology - PAP( [facility]) ° °2. Other fatigue °Check routine, fasting labs.  Include thyroid panel for further evaluation. ° °3. Over weight °Check thyroid panel for further evaluation. Discussed lowering calorie intake to 1500 calories per day and incorporating exercise into daily routine to help lose weight. °- TSH; Future °- T4, free; Future °- T4, free °- TSH ° °4. Vaginal discharge °Obtain vaginal swabs to check bacterial vaginosis and possible yeast infection.  We will treat as indicated based on results. °- Bacterial Vaginosis, NAA ° °5. Potential exposure to STD °Obtain vaginal swab for GC/chlamydia and trichomonas.  Blood work to check for HIV and syphilis °- HIV antibody (with reflex) °- RPR °- GC/Chlamydia Probe Amp; Future °- Trichomonas vaginalis, RNA °- GC/Chlamydia Probe Amp ° °6. Need for Tdap vaccination °Vaccine administered during today's visit. °- Tdap vaccine greater than or equal to 7yo IM ° °7. Health care maintenance °Labs drawn during today's visit °- CBC; Future °- TSH; Future °- Comp Met (CMET); Future °- T4, free; Future °- Lipid panel; Future °- Hemoglobin A1c; Future °- Lipid panel °- T4, free °- Comp Met (CMET) °- TSH °- CBC °- Hemoglobin A1c  ° ° °Problem List Items Addressed This Visit   ° °  ° Other  ° Other fatigue  ° Over weight  ° Relevant Orders  ° TSH  ° T4, free  ° Vaginal   Vaginal discharge   Relevant Orders   Bacterial Vaginosis, NAA   Potential exposure to STD   Relevant Orders   HIV antibody (with reflex)   RPR   GC/Chlamydia Probe Amp   Trichomonas vaginalis, RNA   Other Visit Diagnoses     Well woman exam    -  Primary   Relevant Orders   Cytology - PAP( [facility])   Need for Tdap vaccination        Relevant Orders   Tdap vaccine greater than or equal to 7yo IM (Completed)   Health care maintenance       Relevant Orders   CBC   TSH   Comp Met (CMET)   T4, free   Lipid panel   Hemoglobin A1c       Follow-up: Return in about 1 year (around 07/03/2022) for health maintenance exam.    Ronnell Freshwater, NP  This note was dictated using Dragon Voice Recognition Software. Rapid proofreading was performed to expedite the delivery of the information. Despite proofreading, phonetic errors will occur which are common with this voice recognition software. Please take this into consideration. If there are any concerns, please contact our office.

## 2021-07-04 LAB — CBC
Hematocrit: 40.5 % (ref 34.0–46.6)
Hemoglobin: 13 g/dL (ref 11.1–15.9)
MCH: 27.5 pg (ref 26.6–33.0)
MCHC: 32.1 g/dL (ref 31.5–35.7)
MCV: 86 fL (ref 79–97)
Platelets: 320 10*3/uL (ref 150–450)
RBC: 4.73 x10E6/uL (ref 3.77–5.28)
RDW: 13.9 % (ref 11.7–15.4)
WBC: 9 10*3/uL (ref 3.4–10.8)

## 2021-07-04 LAB — COMPREHENSIVE METABOLIC PANEL
ALT: 13 IU/L (ref 0–32)
AST: 15 IU/L (ref 0–40)
Albumin/Globulin Ratio: 1.7 (ref 1.2–2.2)
Albumin: 4.8 g/dL (ref 3.9–5.0)
Alkaline Phosphatase: 78 IU/L (ref 44–121)
BUN/Creatinine Ratio: 14 (ref 9–23)
BUN: 12 mg/dL (ref 6–20)
Bilirubin Total: 0.5 mg/dL (ref 0.0–1.2)
CO2: 24 mmol/L (ref 20–29)
Calcium: 9.6 mg/dL (ref 8.7–10.2)
Chloride: 102 mmol/L (ref 96–106)
Creatinine, Ser: 0.88 mg/dL (ref 0.57–1.00)
Globulin, Total: 2.8 g/dL (ref 1.5–4.5)
Glucose: 94 mg/dL (ref 70–99)
Potassium: 4.1 mmol/L (ref 3.5–5.2)
Sodium: 139 mmol/L (ref 134–144)
Total Protein: 7.6 g/dL (ref 6.0–8.5)
eGFR: 93 mL/min/{1.73_m2} (ref >59)

## 2021-07-04 LAB — RPR: RPR Ser Ql: NONREACTIVE

## 2021-07-04 LAB — HIV ANTIBODY (ROUTINE TESTING W REFLEX): HIV Screen 4th Generation wRfx: NONREACTIVE

## 2021-07-04 LAB — HEMOGLOBIN A1C
Est. average glucose Bld gHb Est-mCnc: 105 mg/dL
Hgb A1c MFr Bld: 5.3 % (ref 4.8–5.6)

## 2021-07-04 LAB — T4, FREE: Free T4: 1.24 ng/dL (ref 0.82–1.77)

## 2021-07-04 LAB — LIPID PANEL
Chol/HDL Ratio: 5.8 ratio — ABNORMAL HIGH (ref 0.0–4.4)
Cholesterol, Total: 184 mg/dL (ref 100–199)
HDL: 32 mg/dL — ABNORMAL LOW (ref >39)
LDL Chol Calc (NIH): 133 mg/dL — ABNORMAL HIGH (ref 0–99)
Triglycerides: 102 mg/dL (ref 0–149)
VLDL Cholesterol Cal: 19 mg/dL (ref 5–40)

## 2021-07-04 LAB — TSH: TSH: 1.64 u[IU]/mL (ref 0.450–4.500)

## 2021-07-04 NOTE — Progress Notes (Signed)
Thus far, labs are good. Waiting on all results.

## 2021-07-05 LAB — BACTERIAL VAGINOSIS, NAA

## 2021-07-05 LAB — CYTOLOGY - PAP: Diagnosis: NEGATIVE

## 2021-07-05 NOTE — Progress Notes (Signed)
Negative for BV.

## 2021-07-06 ENCOUNTER — Encounter: Payer: Self-pay | Admitting: Nurse Practitioner

## 2021-07-06 NOTE — Progress Notes (Signed)
MyChart  message sent to patient. : cholesterol is a little on the high side. I recommend you lkimit your intake of fried and fatty foods and increase intake of lean proteins and fresh vegetables. You should also increase physical activity when possible. All other labs look good. Your screenings for HIV and syphylis were both negative. Your pap smear was normal without evidence of bacterial vaginosis or yeast. We should repeat the pap smear in three years. I am still waiting on results for gonorrhea, chlamydia, and trichomonas

## 2021-08-28 ENCOUNTER — Telehealth: Payer: Self-pay | Admitting: Nurse Practitioner

## 2021-08-28 ENCOUNTER — Other Ambulatory Visit: Payer: Self-pay

## 2021-08-28 ENCOUNTER — Ambulatory Visit (INDEPENDENT_AMBULATORY_CARE_PROVIDER_SITE_OTHER): Payer: Medicaid Other | Admitting: Nurse Practitioner

## 2021-08-28 ENCOUNTER — Encounter: Payer: Self-pay | Admitting: Nurse Practitioner

## 2021-08-28 VITALS — BP 90/60 | HR 55 | Temp 98.2°F | Ht 62.0 in | Wt 147.1 lb

## 2021-08-28 DIAGNOSIS — R6889 Other general symptoms and signs: Secondary | ICD-10-CM | POA: Diagnosis not present

## 2021-08-28 DIAGNOSIS — J0140 Acute pansinusitis, unspecified: Secondary | ICD-10-CM

## 2021-08-28 LAB — POCT INFLUENZA A/B
Influenza A, POC: NEGATIVE
Influenza B, POC: NEGATIVE

## 2021-08-28 MED ORDER — AMOXICILLIN 875 MG PO TABS: 875.0000 mg | ORAL_TABLET | Freq: Two times a day (BID) | ORAL | 0 refills | Status: DC

## 2021-08-28 MED ORDER — AZITHROMYCIN 250 MG PO TABS: ORAL_TABLET | ORAL | 0 refills | Status: DC

## 2021-08-28 NOTE — Progress Notes (Signed)
Established patient visit   Patient: Breanna Nguyen   DOB: May 13, 1996   26 y.o. Female  MRN: [id number] Visit Date: 08/28/2021  Chief Complaint  Patient presents with   Cough   Subjective    Patient did take home test for COVID 19 yesterday and results were negative   URI  This is a new problem. The current episode started in the past 7 days. The problem has been gradually worsening. There has been no fever. Associated symptoms include congestion, coughing, ear pain, a plugged ear sensation, rhinorrhea, sinus pain, a sore throat and swollen glands. Pertinent negatives include no abdominal pain, chest pain, diarrhea, dysuria, headaches, joint pain, joint swelling, nausea, rash, sneezing, vomiting or wheezing. Treatments tried: took mucus relief for symptoms relief which did not help. The treatment provided no relief.     Medications: Outpatient Medications Prior to Visit  Medication Sig   [DISCONTINUED] doxycycline (VIBRA-TABS) 100 MG tablet Take 1 tablet (100 mg total) by mouth 2 (two) times daily.   [DISCONTINUED] clobetasol cream (TEMOVATE) 0.05 % Apply 1 application topically 2 (two) times daily. (Patient not taking: Reported on 07/03/2021)   No facility-administered medications prior to visit.    Review of Systems  Constitutional:  Positive for fatigue. Negative for activity change, appetite change, chills and fever.  HENT:  Positive for congestion, ear pain, postnasal drip, rhinorrhea, sinus pain and sore throat. Negative for sinus pressure and sneezing.   Eyes: Negative.   Respiratory:  Positive for cough. Negative for chest tightness, shortness of breath and wheezing.   Cardiovascular:  Negative for chest pain and palpitations.  Gastrointestinal:  Negative for abdominal pain, constipation, diarrhea, nausea and vomiting.  Endocrine: Negative for cold intolerance, heat intolerance, polydipsia and polyuria.  Genitourinary:  Negative for dyspareunia, dysuria, flank pain, frequency  and urgency.  Musculoskeletal:  Negative for arthralgias, back pain, joint pain and myalgias.  Skin:  Negative for rash.  Allergic/Immunologic: Negative for environmental allergies.  Neurological:  Negative for dizziness, weakness and headaches.  Hematological:  Positive for adenopathy.  Psychiatric/Behavioral:  The patient is not nervous/anxious.     Objective     Today's Vitals   08/28/21 1451  BP: 90/60  Pulse: (!) 55  Temp: 98.2 F (36.8 C)  SpO2: 97%  Weight: 147 lb 1.6 oz (66.7 kg)   Body mass index is 26.9 kg/m.    Physical Exam Vitals and nursing note reviewed.  Constitutional:      Appearance: Normal appearance. She is well-developed. She is ill-appearing.  HENT:     Head: Normocephalic.     Right Ear: Tympanic membrane is bulging.     Left Ear: Tympanic membrane is bulging.     Nose: Congestion and rhinorrhea present. Rhinorrhea is clear.     Mouth/Throat:     Pharynx: Posterior oropharyngeal erythema present.     Tonsils: 1+ on the right. 1+ on the left.  Eyes:     Pupils: Pupils are equal, round, and reactive to light.  Cardiovascular:     Rate and Rhythm: Normal rate and regular rhythm.     Pulses: Normal pulses.     Heart sounds: Normal heart sounds.  Pulmonary:     Effort: Pulmonary effort is normal.     Breath sounds: Normal breath sounds.     Comments: Congested, nonproductive cough noted  Abdominal:     Palpations: Abdomen is soft.  Musculoskeletal:        General: Normal range of motion.  Cervical back: Normal range of motion and neck supple.  Lymphadenopathy:     Cervical: Cervical adenopathy present.  Skin:    General: Skin is warm and dry.     Capillary Refill: Capillary refill takes less than 2 seconds.  Neurological:     General: No focal deficit present.     Mental Status: She is alert and oriented to person, place, and time.  Psychiatric:        Mood and Affect: Mood normal.        Behavior: Behavior normal.        Thought  Content: Thought content normal.        Judgment: Judgment normal.      Assessment & Plan     1. Flu-like symptoms Rapid flu test negative today.  We will treat as sinusitis. - POCT Influenza A/B  2. Acute non-recurrent pansinusitis Start Z-Pak.  Take as directed for 5 days. Rest and increase fluids. Continue using OTC medication to control symptoms.   - azithromycin (ZITHROMAX) 250 MG tablet; z-pack - take as directed for 5 days  Dispense: 6 tablet; Refill: 0   Return for prn worsening or persistent symptoms.        Carlean Jews, NP  [facility] at [facility] [phone] (phone) [phone] (fax)  [facility]

## 2021-08-28 NOTE — Telephone Encounter (Signed)
I changed the antibiotic to z-pack. She should take as directed for 5 days. Sent this to [facility].

## 2021-08-28 NOTE — Telephone Encounter (Signed)
Patient called stating her pharmacy does not have the strength of amoxicillin that was prescribed that it is on back order and wants to know if it can be changed? Please advise. [phone]

## 2021-11-02 IMAGING — US US MFM OB LIMITED
1 series · 15 of 28 positions shown · non-contrast
Comparison: none

[Series 1: us mfm ob limited · 29 acquisitions, 15 frames shown]
[im 1/29]
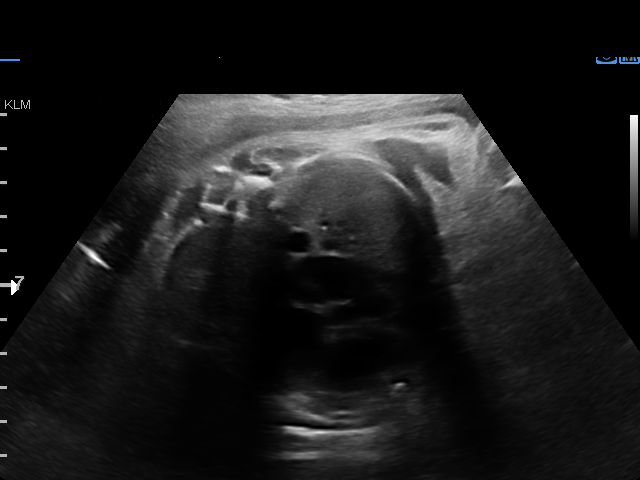
[im 3/29]
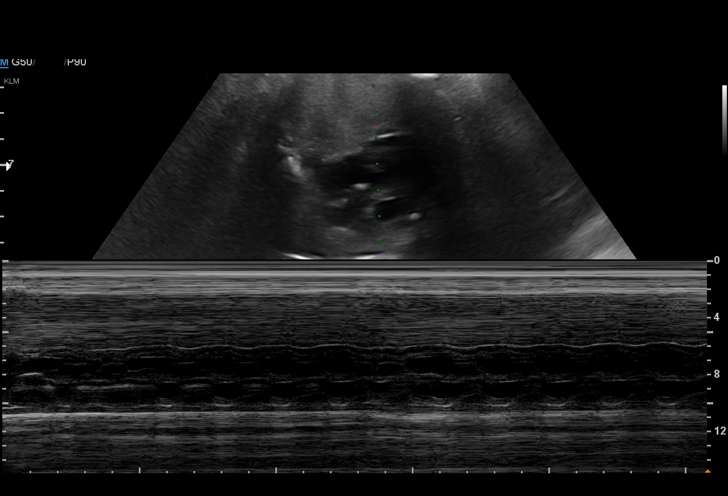
[im 5/29]
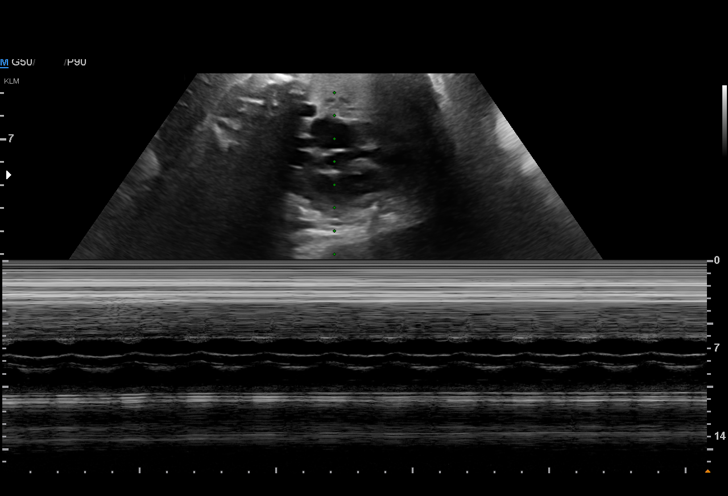
[im 7/29]
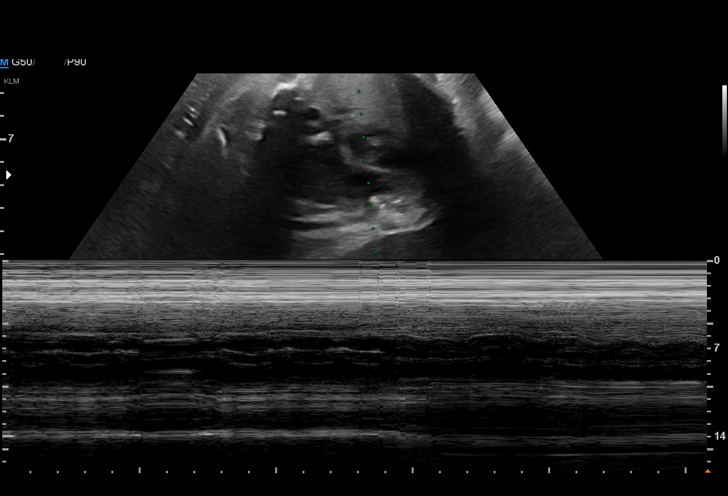
[im 9/29]
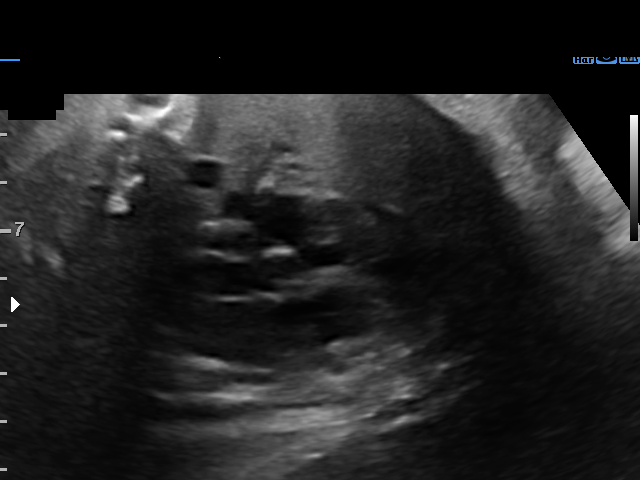
[im 11/29]
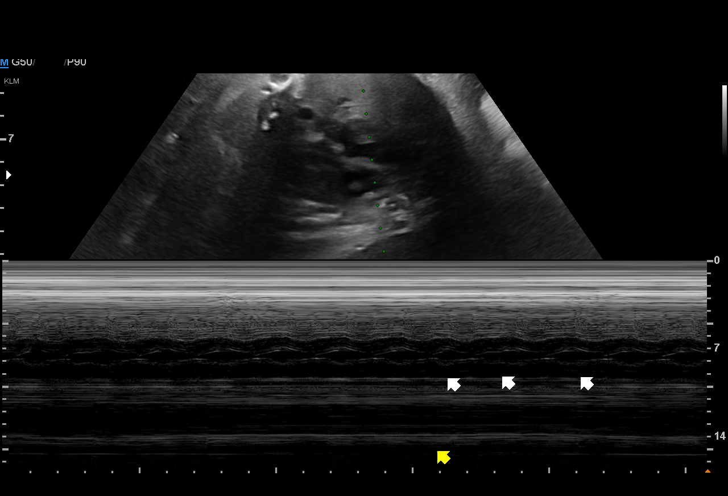
[im 13/29]
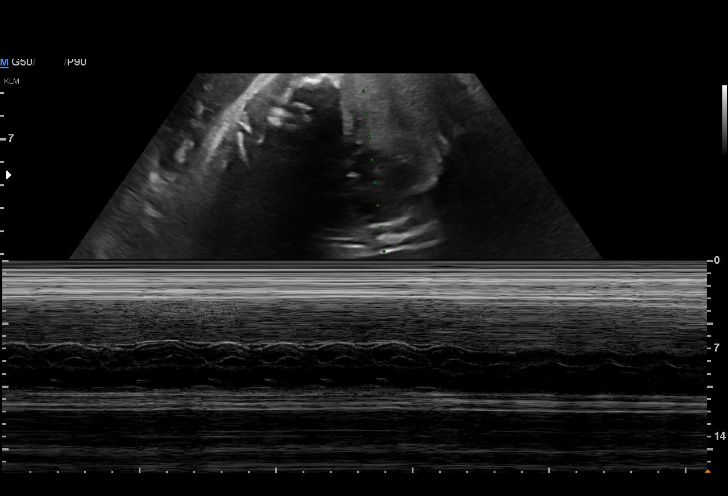
[im 15/29]
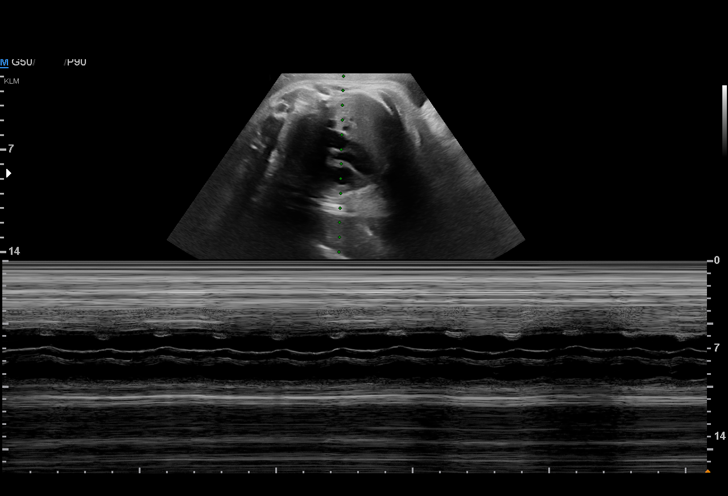
[im 16/29]
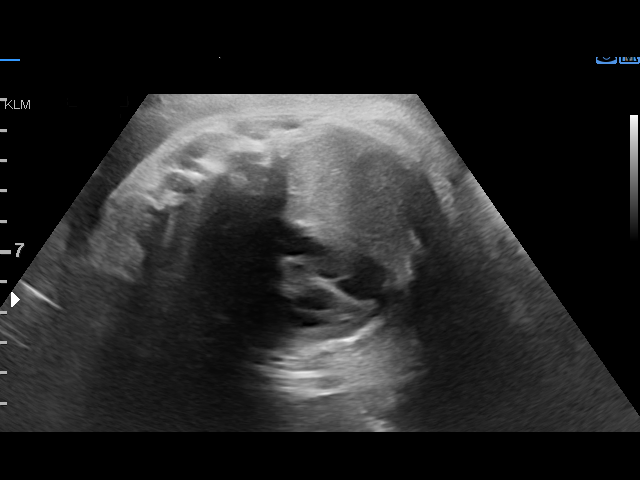
[im 18/29]
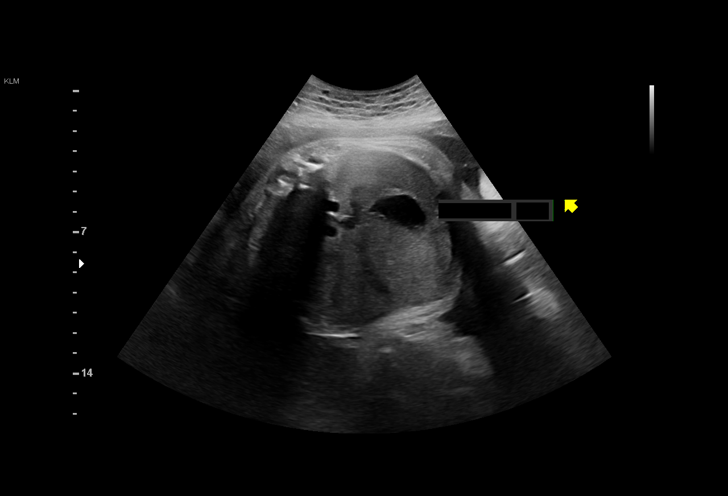
[im 20/29]
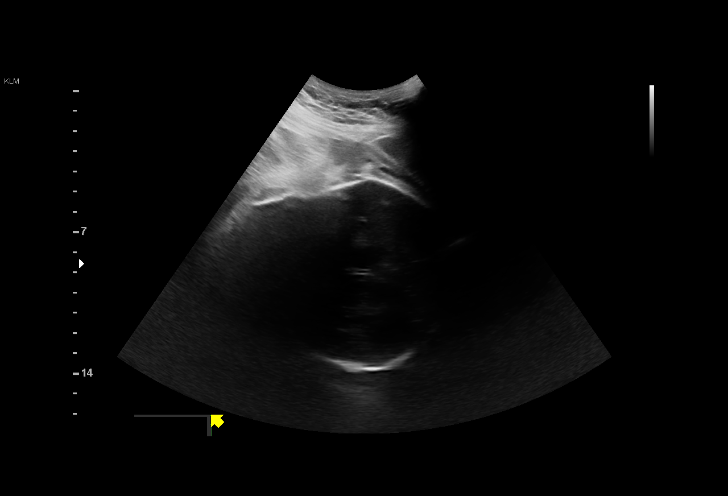
[im 22/29]
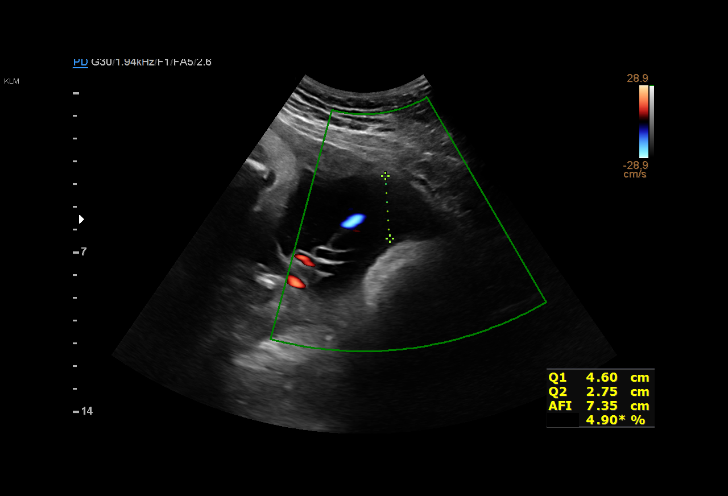
[im 24/29]
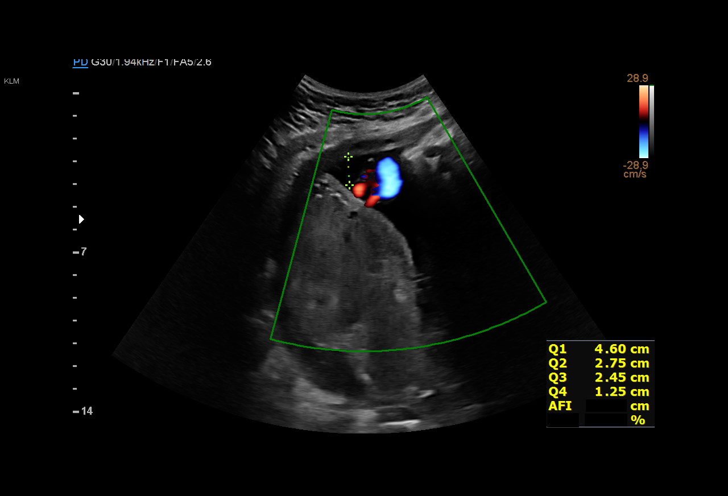
[im 26/29]
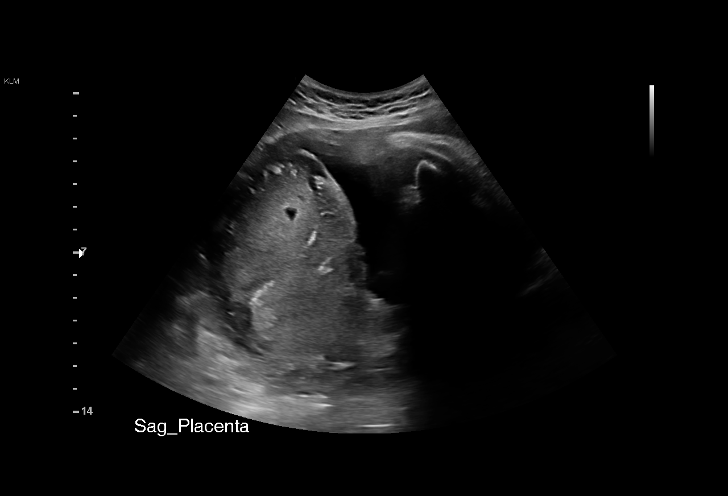
[im 29/29]
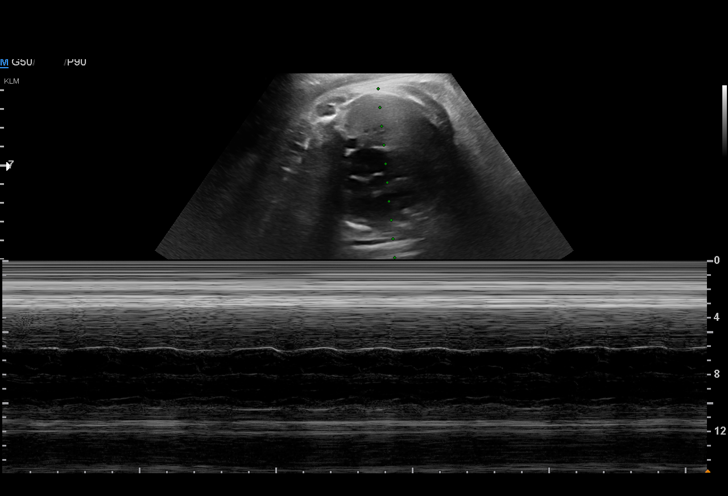

[15 of 28 positions shown; findings below may reference images not displayed]

[REDACTED]care
                   [facility]                               [HOSPITAL]

 1  US MFM OB LIMITED                     76815.01    NARALDO MUTINOUS

Indications

 37 weeks gestation of pregnancy
 Fetal arrhythmia affecting pregnancy,
 antepartum
Fetal Evaluation

 Num Of Fetuses:         1
 Fetal Heart Rate(bpm):  143
 Cardiac Activity:       Arrhythmia noted
 Presentation:           Cephalic
 Placenta:               Posterior

 Amniotic Fluid
 AFI FV:      Within normal limits

 AFI Sum(cm)     %Tile       Largest Pocket(cm)
 11.2            34

 RUQ(cm)       RLQ(cm)       LUQ(cm)        LLQ(cm)

OB History

 Gravidity:    1         Term:   0        Prem:   0        SAB:   0
 TOP:          0       Ectopic:  0        Living: 0
Gestational Age

 Clinical EDD:  37w 4d                                        EDD:   10/09/20
 Best:          37w 4d     Det. By:  Clinical EDD             EDD:   10/09/20
Anatomy

 Stomach:               Appears normal, left   Bladder:                Appears normal
                        sided
Cervix Uterus Adnexa

 Cervix
 Not visualized (advanced GA >92wks)
Impression

 Limited exam to assess fetal arrhythmia.
 Normal fetal heart rate with abnormal rhythm.
 There is no evidence of fetal tachycardia or hydrops.

 Today's findings suggest premature atrial contractions
 occuring regularly irregular every 10-15 bpm.

 Premature atrial contractions (PACs), the most common fetal
 arrhythmia, manifest as an irregularly irregular variability in
 fetal heart rate, usually beginning between 18 and 24 weeks'
 gestation, but occasionally appearing initially during the third
 trimester. Rarely, PACs are associated with intermittent SVT.
 PACs may be exacerbated by ingestion of caffeine,
 decongestant medications (stimulants), or tobacco. Typically,
 PACs resolve spontaneously within 2 to 3 weeks after
 diagnosis. PACs do not represent any real risk to the fetus,
 and they do not require treatment. However, a small
 percentage of fetuses with isolated PACs develop
 supraventricular tachycardia (SVT). We recommend weekly
 fetal auscultation in office for surveillance if SVT is noted
 please refer back to MFM or consider delivery if at 39 week
 with postnatal echocardiogram and cardiology consultation.

## 2021-11-29 DIAGNOSIS — Z20822 Contact with and (suspected) exposure to covid-19: Secondary | ICD-10-CM | POA: Diagnosis not present

## 2021-11-29 DIAGNOSIS — H6693 Otitis media, unspecified, bilateral: Secondary | ICD-10-CM | POA: Diagnosis not present

## 2022-02-19 DIAGNOSIS — N760 Acute vaginitis: Secondary | ICD-10-CM | POA: Diagnosis not present

## 2022-02-19 DIAGNOSIS — Z0000 Encounter for general adult medical examination without abnormal findings: Secondary | ICD-10-CM | POA: Diagnosis not present

## 2022-02-19 DIAGNOSIS — Z113 Encounter for screening for infections with a predominantly sexual mode of transmission: Secondary | ICD-10-CM | POA: Diagnosis not present

## 2022-02-19 DIAGNOSIS — Z131 Encounter for screening for diabetes mellitus: Secondary | ICD-10-CM | POA: Diagnosis not present

## 2022-02-21 ENCOUNTER — Ambulatory Visit: Payer: Medicaid Other | Admitting: Nurse Practitioner

## 2022-02-25 ENCOUNTER — Encounter: Payer: Self-pay | Admitting: Nurse Practitioner

## 2022-02-25 ENCOUNTER — Ambulatory Visit (INDEPENDENT_AMBULATORY_CARE_PROVIDER_SITE_OTHER): Payer: Medicaid Other | Admitting: Nurse Practitioner

## 2022-02-25 VITALS — BP 90/57 | HR 55 | Ht 62.0 in | Wt 145.1 lb

## 2022-02-25 DIAGNOSIS — L309 Dermatitis, unspecified: Secondary | ICD-10-CM | POA: Diagnosis not present

## 2022-02-25 MED ORDER — METHYLPREDNISOLONE 4 MG PO TBPK: ORAL_TABLET | ORAL | 0 refills | Status: DC

## 2022-02-25 MED ORDER — TACROLIMUS 0.03 % EX OINT: TOPICAL_OINTMENT | Freq: Two times a day (BID) | CUTANEOUS | 0 refills | Status: DC

## 2022-02-25 NOTE — Progress Notes (Signed)
Established patient visit   Patient: Breanna Nguyen   DOB: July 27, 1995   26 y.o. Female  MRN: [id number] Visit Date: 02/25/2022  Chief Complaint  Patient presents with   Eczema   Subjective    HPI  The patient states that her skin is really bothering her. Flare of her eczema, most severe on her hands and the forearms. Currently prescribed clobetasol cream not working as well as it once was. Unsure of what aggrvates the condition. Feels like it can be different things on different days.   Medications: Outpatient Medications Prior to Visit  Medication Sig   azithromycin (ZITHROMAX) 250 MG tablet z-pack - take as directed for 5 days   No facility-administered medications prior to visit.    Review of Systems  Constitutional:  Negative for activity change, appetite change, chills, fatigue and fever.  HENT:  Negative for congestion, postnasal drip, rhinorrhea, sinus pressure, sinus pain, sneezing and sore throat.   Eyes: Negative.   Respiratory:  Negative for cough, chest tightness, shortness of breath and wheezing.   Cardiovascular:  Negative for chest pain and palpitations.  Gastrointestinal:  Negative for abdominal pain, constipation, diarrhea, nausea and vomiting.  Endocrine: Negative for cold intolerance, heat intolerance, polydipsia and polyuria.  Genitourinary:  Negative for dyspareunia, dysuria, flank pain, frequency and urgency.  Musculoskeletal:  Positive for back pain. Negative for arthralgias and myalgias.  Skin:  Negative for rash.       Dry, flaky, itchy areas of skin on the figer tips, the palms of the hands, the forearms, and the backs of the knees. Current cream she is using is not helping.   Allergic/Immunologic: Negative for environmental allergies.  Neurological:  Negative for dizziness, weakness and headaches.  Hematological:  Negative for adenopathy.  Psychiatric/Behavioral:  The patient is not nervous/anxious.      Objective     Today's Vitals   02/25/22  0841  BP: (!) 90/57  Pulse: (!) 55  SpO2: 98%  Weight: 145 lb 1.9 oz (65.8 kg)  Height: 5\' 2"  (1.575 m)   Body mass index is 26.54 kg/m.   Physical Exam Vitals and nursing note reviewed.  Constitutional:      Appearance: Normal appearance. She is well-developed.  HENT:     Head: Normocephalic and atraumatic.     Nose: Nose normal.     Mouth/Throat:     Mouth: Mucous membranes are moist.     Pharynx: Oropharynx is clear.  Eyes:     Conjunctiva/sclera: Conjunctivae normal.     Pupils: Pupils are equal, round, and reactive to light.  Cardiovascular:     Rate and Rhythm: Normal rate and regular rhythm.     Pulses: Normal pulses.     Heart sounds: Normal heart sounds.  Pulmonary:     Effort: Pulmonary effort is normal.     Breath sounds: Normal breath sounds.  Abdominal:     Palpations: Abdomen is soft.  Musculoskeletal:        General: Normal range of motion.     Cervical back: Normal range of motion and neck supple.  Lymphadenopathy:     Cervical: No cervical adenopathy.  Skin:    General: Skin is warm and dry.     Capillary Refill: Capillary refill takes less than 2 seconds.     Comments: Very dry, rough areas of skin on the lateral aspects of both hands, in the fingertips, and on the forearms. There are also a few areas on both legs. Skin texture is  rough. Skin is intact.   Neurological:     General: No focal deficit present.     Mental Status: She is alert and oriented to person, place, and time.  Psychiatric:        Mood and Affect: Mood normal.        Behavior: Behavior normal.        Thought Content: Thought content normal.        Judgment: Judgment normal.       Assessment & Plan     1. Eczema, unspecified type Start medrol taper. Take as directed for 6 days. Change clobetasol to protopic ointment. Apply to effected areas twice daily. Consider dermatology appointment as scheduled.  - tacrolimus (PROTOPIC) 0.03 % ointment; Apply topically 2 (two) times  daily.  Dispense: 100 g; Refill: 0 - methylPREDNISolone (MEDROL) 4 MG TBPK tablet; Take by mouth as directed for 6 days  Dispense: 21 tablet; Refill: 0   Return for prn worsening or persistent symptoms.        Carlean Jews, NP  [facility] at [facility] [phone] (phone) [phone] (fax)  [facility]

## 2022-03-17 NOTE — Progress Notes (Unsigned)
Established patient visit   Patient: Breanna Nguyen   DOB: 10/19/1995   26 y.o. Female  MRN: [id number] Visit Date: 03/18/2022  No chief complaint on file.  Subjective    HPI  Persistent flare of eczema -last seen 02/25/2022 for similar symptoms.  --given medrol taper and protopic to apply to effective areas.  -needs referral to dermatology    Medications: Outpatient Medications Prior to Visit  Medication Sig   azithromycin (ZITHROMAX) 250 MG tablet z-pack - take as directed for 5 days   methylPREDNISolone (MEDROL) 4 MG TBPK tablet Take by mouth as directed for 6 days   tacrolimus (PROTOPIC) 0.03 % ointment Apply topically 2 (two) times daily.   No facility-administered medications prior to visit.    Review of Systems   Objective    There were no vitals filed for this visit. There is no height or weight on file to calculate BMI.   Physical Exam  ***  No results found for any visits on 03/18/22.  Assessment & Plan     ***  No follow-ups on file.        Carlean Jews, NP  [facility] at [facility] [phone] (phone) [phone] (fax)  [facility]

## 2022-03-18 ENCOUNTER — Encounter: Payer: Self-pay | Admitting: Nurse Practitioner

## 2022-03-18 ENCOUNTER — Ambulatory Visit (INDEPENDENT_AMBULATORY_CARE_PROVIDER_SITE_OTHER): Payer: Medicaid Other | Admitting: Nurse Practitioner

## 2022-03-18 VITALS — BP 97/61 | HR 47 | Ht 62.0 in | Wt 142.4 lb

## 2022-03-18 DIAGNOSIS — L309 Dermatitis, unspecified: Secondary | ICD-10-CM

## 2022-03-18 DIAGNOSIS — R21 Rash and other nonspecific skin eruption: Secondary | ICD-10-CM | POA: Diagnosis not present

## 2022-03-18 MED ORDER — CLOBETASOL PROPIONATE 0.05 % EX CREA: 1.0000 | TOPICAL_CREAM | Freq: Two times a day (BID) | CUTANEOUS | 2 refills | Status: DC

## 2022-03-18 MED ORDER — PREDNISONE 10 MG (48) PO TBPK: ORAL_TABLET | ORAL | 0 refills | Status: DC

## 2022-03-18 MED ORDER — HYDROXYZINE HCL 25 MG PO TABS: ORAL_TABLET | ORAL | 2 refills | Status: DC

## 2022-03-18 MED ORDER — TACROLIMUS 0.03 % EX OINT: TOPICAL_OINTMENT | Freq: Two times a day (BID) | CUTANEOUS | 2 refills | Status: DC

## 2022-03-19 ENCOUNTER — Ambulatory Visit: Payer: Medicaid Other | Admitting: Dermatology

## 2022-03-19 DIAGNOSIS — Z79899 Other long term (current) drug therapy: Secondary | ICD-10-CM

## 2022-03-19 DIAGNOSIS — L209 Atopic dermatitis, unspecified: Secondary | ICD-10-CM | POA: Diagnosis not present

## 2022-03-19 MED ORDER — DUPILUMAB 300 MG/2ML ~~LOC~~ SOSY
600.0000 mg | PREFILLED_SYRINGE | Freq: Once | SUBCUTANEOUS | Status: AC
  Administered 2022-03-19: 600 mg via SUBCUTANEOUS

## 2022-03-19 MED ORDER — DUPIXENT 300 MG/2ML ~~LOC~~ SOAJ: SUBCUTANEOUS | 5 refills | Status: DC

## 2022-03-19 MED ORDER — TACROLIMUS 0.1 % EX OINT: TOPICAL_OINTMENT | CUTANEOUS | 0 refills | Status: DC

## 2022-03-19 NOTE — Progress Notes (Signed)
New Patient Visit  Subjective  Breanna Nguyen is a 26 y.o. female who presents for the following: Rash (Of the face and extremities - severe, chronic, and persistent. Patient was diagnosed as a child and has had issues with eczema all her life. Condition has worsened over the past year and flares with any water involvement. She was prescribed Prednisone taper x 12 days yesterday but wanted to wait until today's visit to see if she needed to fill it. Patient currently using Clobetasol 0.05% cream BID and Protopic 0.03% ointment BID. ).  Patient accompanied by mother and daughter today.  The following portions of the chart were reviewed this encounter and updated as appropriate:   Tobacco  Allergies  Meds  Problems  Med Hx  Surg Hx  Fam Hx     Review of Systems:  No other skin or systemic complaints except as noted in HPI or Assessment and Plan.  Objective  Well appearing patient in no apparent distress; mood and affect are within normal limits.  A focused examination was performed including the face and extremities. Relevant physical exam findings are noted in the Assessment and Plan.  Trunk, extremities, face Erosions, crusting, and peeling on the hands. Antecubital areas with pinkness and scale. Eyelids with pinkness and edema. Popliteal areas with crusting and pinkness. Crusting and erythema of the postauricular and ears.                           Assessment & Plan  Atopic dermatitis, Severe Trunk, extremities, face  Worsened by water and fragrances - impacts aspects of daily living, difficult to give daughter baths, wash dishes, and bathe herself. Chronic and persistent condition with duration or expected duration over one year. Condition is bothersome/symptomatic for patient. Currently flared despite treatment with Clobetasol and Protopic. On oral systemic steroid taper two weeks ago which helped, but didn't clear her.   Patient is not pregnant or  breast feeding nor does she plan on becoming pregnant.  Atopic dermatitis (eczema) is a chronic, relapsing, pruritic condition that can significantly affect quality of life. It is often associated with allergic rhinitis and/or asthma and can require treatment with topical medications, phototherapy, or in severe cases biologic injectable medication (Dupixent; Adbry) or Oral JAK inhibitors.  Recommend Dreft, Tide, or All "free" laundry detergents.   Dupilumab (Dupixent) is a treatment given by injection for adults and children with moderate-to-severe atopic dermatitis. Goal is control of skin condition, not cure. It is given as 2 injections at the first dose followed by 1 injection ever 2 weeks thereafter.  Young children are dosed monthly.  Discussed and recommend Dupixent injections. Potential side effects include allergic reaction, herpes infections, injection site reactions and conjunctivitis (inflammation of the eyes).  The use of Dupixent requires long term medication management, including periodic office visits.  Dupixent 300mg /50mL injected SQ into the R upper arm post and Dupixent 300mg /14mL injected SQ into the L post arm. Patient tolerated injections well. Will plan to use samples until Dupixent is approved by insurance.   Start Protopic 0.1% ointment to aa's face BID PRN.   Continue Clobetasol 0.05% cream to aa's BID PRN. Topical steroids (such as triamcinolone, fluocinolone, fluocinonide, mometasone, clobetasol, halobetasol, betamethasone, hydrocortisone) can cause thinning and lightening of the skin if they are used for too long in the same area. Your physician has selected the right strength medicine for your problem and area affected on the body. Please use your medication  only as directed by your physician to prevent side effects.   tacrolimus (PROTOPIC) 0.1 % ointment - Trunk, extremities, face Apply to aa's eczema BID.  Dupilumab (DUPIXENT) 300 MG/2ML SOPN - Trunk, extremities,  face Starting at day 15 for maintenance inject one pen SQ Q2W.  dupilumab (DUPIXENT) prefilled syringe 600 mg - Trunk, extremities, face  Return in about 2 weeks (around 04/02/2022) for follow up and Dupixent injection .  Maylene Roes, CMA, am acting as scribe for Armida Sans, MD . Documentation: I have reviewed the above documentation for accuracy and completeness, and I agree with the above.  Armida Sans, MD

## 2022-03-19 NOTE — Patient Instructions (Addendum)
Dupilumab (Dupixent) is a treatment given by injection for adults and children with moderate-to-severe atopic dermatitis. Goal is control of skin condition, not cure. It is given as 2 injections at the first dose followed by 1 injection ever 2 weeks thereafter.  Young children are dosed monthly.  Potential side effects include allergic reaction, herpes infections, injection site reactions and conjunctivitis (inflammation of the eyes).  The use of Dupixent requires long term medication management, including periodic office visits.    Due to recent changes in healthcare laws, you may see results of your pathology and/or laboratory studies on MyChart before the doctors have had a chance to review them. We understand that in some cases there may be results that are confusing or concerning to you. Please understand that not all results are received at the same time and often the doctors may need to interpret multiple results in order to provide you with the best plan of care or course of treatment. Therefore, we ask that you please give us 2 business days to thoroughly review all your results before contacting the office for clarification. Should we see a critical lab result, you will be contacted sooner.   If You Need Anything After Your Visit  If you have any questions or concerns for your doctor, please call our main line at [phone] and press option 4 to reach your doctor's medical assistant. If no one answers, please leave a voicemail as directed and we will return your call as soon as possible. Messages left after 4 pm will be answered the following business day.   You may also send us a message via MyChart. We typically respond to MyChart messages within 1-2 business days.  For prescription refills, please ask your pharmacy to contact our office. Our fax number is [phone].  If you have an urgent issue when the clinic is closed that cannot wait until the next business day, you can page  your doctor at the number below.    Please note that while we do our best to be available for urgent issues outside of office hours, we are not available 24/7.   If you have an urgent issue and are unable to reach us, you may choose to seek medical care at your doctor's office, retail clinic, urgent care center, or emergency room.  If you have a medical emergency, please immediately call 911 or go to the emergency department.  Pager Numbers  - Dr. Kowalski: [phone]  - Dr. Moye: [phone]  - Dr. Stewart: [phone]  In the event of inclement weather, please call our main line at [phone] for an update on the status of any delays or closures.  Dermatology Medication Tips: Please keep the boxes that topical medications come in in order to help keep track of the instructions about where and how to use these. Pharmacies typically print the medication instructions only on the boxes and not directly on the medication tubes.   If your medication is too expensive, please contact our office at [phone] option 4 or send us a message through MyChart.   We are unable to tell what your co-pay for medications will be in advance as this is different depending on your insurance coverage. However, we may be able to find a substitute medication at lower cost or fill out paperwork to get insurance to cover a needed medication.   If a prior authorization is required to get your medication covered by your insurance company, please allow us 1-2 business days to   complete this process.  Drug prices often vary depending on where the prescription is filled and some pharmacies may offer cheaper prices.  The website www.goodrx.com contains coupons for medications through different pharmacies. The prices here do not account for what the cost may be with help from insurance (it may be cheaper with your insurance), but the website can give you the price if you did not use any insurance.  - You can  print the associated coupon and take it with your prescription to the pharmacy.  - You may also stop by our office during regular business hours and pick up a GoodRx coupon card.  - If you need your prescription sent electronically to a different pharmacy, notify our office through [facility] MyChart or by phone at [phone] option 4.     Si Usted Necesita Algo Despus de Su Visita  Tambin puede enviarnos un mensaje a travs de MyChart. Por lo general respondemos a los mensajes de MyChart en el transcurso de 1 a 2 das hbiles.  Para renovar recetas, por favor pida a su farmacia que se ponga en contacto con nuestra oficina. Nuestro nmero de fax es el [phone].  Si tiene un asunto urgente cuando la clnica est cerrada y que no puede esperar hasta el siguiente da hbil, puede llamar/localizar a su doctor(a) al nmero que aparece a continuacin.   Por favor, tenga en cuenta que aunque hacemos todo lo posible para estar disponibles para asuntos urgentes fuera del horario de oficina, no estamos disponibles las 24 horas del da, los 7 das de la semana.   Si tiene un problema urgente y no puede comunicarse con nosotros, puede optar por buscar atencin mdica  en el consultorio de su doctor(a), en una clnica privada, en un centro de atencin urgente o en una sala de emergencias.  Si tiene una emergencia mdica, por favor llame inmediatamente al 911 o vaya a la sala de emergencias.  Nmeros de bper  - Dr. Kowalski: [phone]  - Dra. Moye: [phone]  - Dra. Stewart: [phone]  En caso de inclemencias del tiempo, por favor llame a nuestra lnea principal al [phone] para una actualizacin sobre el estado de cualquier retraso o cierre.  Consejos para la medicacin en dermatologa: Por favor, guarde las cajas en las que vienen los medicamentos de uso tpico para ayudarle a seguir las instrucciones sobre dnde y cmo usarlos. Las farmacias generalmente imprimen las  instrucciones del medicamento slo en las cajas y no directamente en los tubos del medicamento.   Si su medicamento es muy caro, por favor, pngase en contacto con nuestra oficina llamando al [phone] y presione la opcin 4 o envenos un mensaje a travs de MyChart.   No podemos decirle cul ser su copago por los medicamentos por adelantado ya que esto es diferente dependiendo de la cobertura de su seguro. Sin embargo, es posible que podamos encontrar un medicamento sustituto a menor costo o llenar un formulario para que el seguro cubra el medicamento que se considera necesario.   Si se requiere una autorizacin previa para que su compaa de seguros cubra su medicamento, por favor permtanos de 1 a 2 das hbiles para completar este proceso.  Los precios de los medicamentos varan con frecuencia dependiendo del lugar de dnde se surte la receta y alguna farmacias pueden ofrecer precios ms baratos.  El sitio web www.goodrx.com tiene cupones para medicamentos de diferentes farmacias. Los precios aqu no tienen en cuenta lo que podra costar con la ayuda del   seguro (puede ser ms barato con su seguro), pero el sitio web puede darle el precio si no utiliz ningn seguro.  - Puede imprimir el cupn correspondiente y llevarlo con su receta a la farmacia.  - Tambin puede pasar por nuestra oficina durante el horario de atencin regular y recoger una tarjeta de cupones de GoodRx.  - Si necesita que su receta se enve electrnicamente a una farmacia diferente, informe a nuestra oficina a travs de MyChart de [facility] o por telfono llamando al [phone] y presione la opcin 4.  

## 2022-03-22 ENCOUNTER — Encounter: Payer: Self-pay | Admitting: Dermatology

## 2022-04-01 ENCOUNTER — Telehealth: Payer: Self-pay

## 2022-04-01 NOTE — Telephone Encounter (Signed)
Insurance is denying this patients Dupixent because she has not tried and failed ONE of the following: Hydrocortisone 2.5% cream/lotion/ointment,  Fluticasone cream/ointment,  Mometasone cream/solution/ointment, Triamcinolone cream/lotion/ointment, or  Halobetasol cream/ointment. Please advise.

## 2022-04-02 ENCOUNTER — Other Ambulatory Visit: Payer: Self-pay

## 2022-04-02 MED ORDER — MOMETASONE FUROATE 0.1 % EX CREA: 1.0000 | TOPICAL_CREAM | Freq: Two times a day (BID) | CUTANEOUS | 1 refills | Status: DC

## 2022-04-02 NOTE — Telephone Encounter (Signed)
Left a VM notifying patient of medication change.

## 2022-04-02 NOTE — Progress Notes (Signed)
Escripted per Dr. Nehemiah Massed

## 2022-04-03 ENCOUNTER — Ambulatory Visit (INDEPENDENT_AMBULATORY_CARE_PROVIDER_SITE_OTHER): Payer: Medicaid Other | Admitting: Dermatology

## 2022-04-03 DIAGNOSIS — L2089 Other atopic dermatitis: Secondary | ICD-10-CM | POA: Diagnosis not present

## 2022-04-03 DIAGNOSIS — Z79899 Other long term (current) drug therapy: Secondary | ICD-10-CM | POA: Diagnosis not present

## 2022-04-03 MED ORDER — DUPILUMAB 300 MG/2ML ~~LOC~~ SOSY
300.0000 mg | PREFILLED_SYRINGE | Freq: Once | SUBCUTANEOUS | Status: AC
  Administered 2022-04-03: 300 mg via SUBCUTANEOUS

## 2022-04-03 NOTE — Progress Notes (Unsigned)
   Follow-Up Visit   Subjective  Breanna Nguyen is a 26 y.o. female who presents for the following: Follow-up (Atopic dermatitis 2 week follow up - Dupixent loading dose 03/19/22 - no change in condition. Will continue Dupixent injections.).  Accompanied by mother  The following portions of the chart were reviewed this encounter and updated as appropriate:   Tobacco  Allergies  Meds  Problems  Med Hx  Surg Hx  Fam Hx     Review of Systems:  No other skin or systemic complaints except as noted in HPI or Assessment and Plan.  Objective  Well appearing patient in no apparent distress; mood and affect are within normal limits.  A focused examination was performed including face, arms, hands. Relevant physical exam findings are noted in the Assessment and Plan.  Pinkness of left antecubital, left postauricular, fingertips, antecubital areas -improved today compared to photos from last visit.   Assessment & Plan  Other atopic dermatitis  Worsened by water and fragrances - impacts aspects of daily living, difficult to give daughter baths, wash dishes, and bathe herself. Chronic and persistent condition with duration or expected duration over one year. Condition is bothersome/symptomatic for patient.     Patient still symptomatic but exam is improved today as compared to photos from last visit.  Atopic dermatitis (eczema) is a chronic, relapsing, pruritic condition that can significantly affect quality of life. It is often associated with allergic rhinitis and/or asthma and can require treatment with topical medications, phototherapy, or in severe cases biologic injectable medication (Dupixent; Adbry) or Oral JAK inhibitors.   Dupilumab (Dupixent) is a treatment given by injection for adults and children with moderate-to-severe atopic dermatitis. Goal is control of skin condition, not cure. It is given as 2 injections at the first dose followed by 1 injection ever 2 weeks thereafter.   Young children are dosed monthly.   Discussed and recommend Dupixent injections. Potential side effects include allergic reaction, herpes infections, injection site reactions and conjunctivitis (inflammation of the eyes).  The use of Dupixent requires long term medication management, including periodic office visits.   Dupixent 300mg /46mL injected SQ to left upper arm. Patient tolerated injection well. Will plan to use samples until Dupixent is approved by insurance. Lot #[id number] Exp 03/2024   Continue Protopic 0.1% ointment to aa's face BID PRN.    Continue Clobetasol 0.05% cream to aa's BID PRN. Topical steroids (such as triamcinolone, fluocinolone, fluocinonide, mometasone, clobetasol, halobetasol, betamethasone, hydrocortisone) can cause thinning and lightening of the skin if they are used for too long in the same area. Your physician has selected the right strength medicine for your problem and area affected on the body. Please use your medication only as directed by your physician to prevent side effects.   dupilumab (DUPIXENT) prefilled syringe 300 mg    Return in about 2 weeks (around 04/17/2022) for Atopic Dermatitis Dupixent injections.  I, Ashok Cordia, CMA, am acting as scribe for Sarina Ser, MD . Documentation: I have reviewed the above documentation for accuracy and completeness, and I agree with the above.  Sarina Ser, MD

## 2022-04-03 NOTE — Patient Instructions (Signed)
Due to recent changes in healthcare laws, you may see results of your pathology and/or laboratory studies on MyChart before the doctors have had a chance to review them. We understand that in some cases there may be results that are confusing or concerning to you. Please understand that not all results are received at the same time and often the doctors may need to interpret multiple results in order to provide you with the best plan of care or course of treatment. Therefore, we ask that you please give us 2 business days to thoroughly review all your results before contacting the office for clarification. Should we see a critical lab result, you will be contacted sooner.   If You Need Anything After Your Visit  If you have any questions or concerns for your doctor, please call our main line at [phone] and press option 4 to reach your doctor's medical assistant. If no one answers, please leave a voicemail as directed and we will return your call as soon as possible. Messages left after 4 pm will be answered the following business day.   You may also send us a message via MyChart. We typically respond to MyChart messages within 1-2 business days.  For prescription refills, please ask your pharmacy to contact our office. Our fax number is [phone].  If you have an urgent issue when the clinic is closed that cannot wait until the next business day, you can page your doctor at the number below.    Please note that while we do our best to be available for urgent issues outside of office hours, we are not available 24/7.   If you have an urgent issue and are unable to reach us, you may choose to seek medical care at your doctor's office, retail clinic, urgent care center, or emergency room.  If you have a medical emergency, please immediately call 911 or go to the emergency department.  Pager Numbers  - Dr. Kowalski: [phone]  - Dr. Moye: [phone]  - Dr. Stewart:  [phone]  In the event of inclement weather, please call our main line at [phone] for an update on the status of any delays or closures.  Dermatology Medication Tips: Please keep the boxes that topical medications come in in order to help keep track of the instructions about where and how to use these. Pharmacies typically print the medication instructions only on the boxes and not directly on the medication tubes.   If your medication is too expensive, please contact our office at [phone] option 4 or send us a message through MyChart.   We are unable to tell what your co-pay for medications will be in advance as this is different depending on your insurance coverage. However, we may be able to find a substitute medication at lower cost or fill out paperwork to get insurance to cover a needed medication.   If a prior authorization is required to get your medication covered by your insurance company, please allow us 1-2 business days to complete this process.  Drug prices often vary depending on where the prescription is filled and some pharmacies may offer cheaper prices.  The website www.goodrx.com contains coupons for medications through different pharmacies. The prices here do not account for what the cost may be with help from insurance (it may be cheaper with your insurance), but the website can give you the price if you did not use any insurance.  - You can print the associated coupon and take it with   your prescription to the pharmacy.  - You may also stop by our office during regular business hours and pick up a GoodRx coupon card.  - If you need your prescription sent electronically to a different pharmacy, notify our office through [facility] MyChart or by phone at [phone] option 4.     Si Usted Necesita Algo Despus de Su Visita  Tambin puede enviarnos un mensaje a travs de MyChart. Por lo general respondemos a los mensajes de MyChart en el transcurso de 1 a 2  das hbiles.  Para renovar recetas, por favor pida a su farmacia que se ponga en contacto con nuestra oficina. Nuestro nmero de fax es el [phone].  Si tiene un asunto urgente cuando la clnica est cerrada y que no puede esperar hasta el siguiente da hbil, puede llamar/localizar a su doctor(a) al nmero que aparece a continuacin.   Por favor, tenga en cuenta que aunque hacemos todo lo posible para estar disponibles para asuntos urgentes fuera del horario de oficina, no estamos disponibles las 24 horas del da, los 7 das de la semana.   Si tiene un problema urgente y no puede comunicarse con nosotros, puede optar por buscar atencin mdica  en el consultorio de su doctor(a), en una clnica privada, en un centro de atencin urgente o en una sala de emergencias.  Si tiene una emergencia mdica, por favor llame inmediatamente al 911 o vaya a la sala de emergencias.  Nmeros de bper  - Dr. Kowalski: [phone]  - Dra. Moye: [phone]  - Dra. Stewart: [phone]  En caso de inclemencias del tiempo, por favor llame a nuestra lnea principal al [phone] para una actualizacin sobre el estado de cualquier retraso o cierre.  Consejos para la medicacin en dermatologa: Por favor, guarde las cajas en las que vienen los medicamentos de uso tpico para ayudarle a seguir las instrucciones sobre dnde y cmo usarlos. Las farmacias generalmente imprimen las instrucciones del medicamento slo en las cajas y no directamente en los tubos del medicamento.   Si su medicamento es muy caro, por favor, pngase en contacto con nuestra oficina llamando al [phone] y presione la opcin 4 o envenos un mensaje a travs de MyChart.   No podemos decirle cul ser su copago por los medicamentos por adelantado ya que esto es diferente dependiendo de la cobertura de su seguro. Sin embargo, es posible que podamos encontrar un medicamento sustituto a menor costo o llenar un formulario para que el  seguro cubra el medicamento que se considera necesario.   Si se requiere una autorizacin previa para que su compaa de seguros cubra su medicamento, por favor permtanos de 1 a 2 das hbiles para completar este proceso.  Los precios de los medicamentos varan con frecuencia dependiendo del lugar de dnde se surte la receta y alguna farmacias pueden ofrecer precios ms baratos.  El sitio web www.goodrx.com tiene cupones para medicamentos de diferentes farmacias. Los precios aqu no tienen en cuenta lo que podra costar con la ayuda del seguro (puede ser ms barato con su seguro), pero el sitio web puede darle el precio si no utiliz ningn seguro.  - Puede imprimir el cupn correspondiente y llevarlo con su receta a la farmacia.  - Tambin puede pasar por nuestra oficina durante el horario de atencin regular y recoger una tarjeta de cupones de GoodRx.  - Si necesita que su receta se enve electrnicamente a una farmacia diferente, informe a nuestra oficina a travs de MyChart de [facility]   o por telfono llamando al [phone] y presione la opcin 4.  

## 2022-04-04 ENCOUNTER — Encounter: Payer: Self-pay | Admitting: Dermatology

## 2022-04-18 ENCOUNTER — Ambulatory Visit (INDEPENDENT_AMBULATORY_CARE_PROVIDER_SITE_OTHER): Payer: Medicaid Other | Admitting: Dermatology

## 2022-04-18 ENCOUNTER — Telehealth: Payer: Self-pay

## 2022-04-18 DIAGNOSIS — L299 Pruritus, unspecified: Secondary | ICD-10-CM

## 2022-04-18 DIAGNOSIS — Z79899 Other long term (current) drug therapy: Secondary | ICD-10-CM | POA: Diagnosis not present

## 2022-04-18 DIAGNOSIS — L2081 Atopic neurodermatitis: Secondary | ICD-10-CM

## 2022-04-18 MED ORDER — DUPILUMAB 300 MG/2ML ~~LOC~~ SOAJ
300.0000 mg | Freq: Once | SUBCUTANEOUS | Status: AC
  Administered 2022-04-18: 300 mg via SUBCUTANEOUS

## 2022-04-18 NOTE — Patient Instructions (Signed)
Due to recent changes in healthcare laws, you may see results of your pathology and/or laboratory studies on MyChart before the doctors have had a chance to review them. We understand that in some cases there may be results that are confusing or concerning to you. Please understand that not all results are received at the same time and often the doctors may need to interpret multiple results in order to provide you with the best plan of care or course of treatment. Therefore, we ask that you please give us 2 business days to thoroughly review all your results before contacting the office for clarification. Should we see a critical lab result, you will be contacted sooner.   If You Need Anything After Your Visit  If you have any questions or concerns for your doctor, please call our main line at [phone] and press option 4 to reach your doctor's medical assistant. If no one answers, please leave a voicemail as directed and we will return your call as soon as possible. Messages left after 4 pm will be answered the following business day.   You may also send us a message via MyChart. We typically respond to MyChart messages within 1-2 business days.  For prescription refills, please ask your pharmacy to contact our office. Our fax number is [phone].  If you have an urgent issue when the clinic is closed that cannot wait until the next business day, you can page your doctor at the number below.    Please note that while we do our best to be available for urgent issues outside of office hours, we are not available 24/7.   If you have an urgent issue and are unable to reach us, you may choose to seek medical care at your doctor's office, retail clinic, urgent care center, or emergency room.  If you have a medical emergency, please immediately call 911 or go to the emergency department.  Pager Numbers  - Dr. Kowalski: [phone]  - Dr. Moye: [phone]  - Dr. Stewart:  [phone]  In the event of inclement weather, please call our main line at [phone] for an update on the status of any delays or closures.  Dermatology Medication Tips: Please keep the boxes that topical medications come in in order to help keep track of the instructions about where and how to use these. Pharmacies typically print the medication instructions only on the boxes and not directly on the medication tubes.   If your medication is too expensive, please contact our office at [phone] option 4 or send us a message through MyChart.   We are unable to tell what your co-pay for medications will be in advance as this is different depending on your insurance coverage. However, we may be able to find a substitute medication at lower cost or fill out paperwork to get insurance to cover a needed medication.   If a prior authorization is required to get your medication covered by your insurance company, please allow us 1-2 business days to complete this process.  Drug prices often vary depending on where the prescription is filled and some pharmacies may offer cheaper prices.  The website www.goodrx.com contains coupons for medications through different pharmacies. The prices here do not account for what the cost may be with help from insurance (it may be cheaper with your insurance), but the website can give you the price if you did not use any insurance.  - You can print the associated coupon and take it with   your prescription to the pharmacy.  - You may also stop by our office during regular business hours and pick up a GoodRx coupon card.  - If you need your prescription sent electronically to a different pharmacy, notify our office through [facility] MyChart or by phone at [phone] option 4.     Si Usted Necesita Algo Despus de Su Visita  Tambin puede enviarnos un mensaje a travs de MyChart. Por lo general respondemos a los mensajes de MyChart en el transcurso de 1 a 2  das hbiles.  Para renovar recetas, por favor pida a su farmacia que se ponga en contacto con nuestra oficina. Nuestro nmero de fax es el [phone].  Si tiene un asunto urgente cuando la clnica est cerrada y que no puede esperar hasta el siguiente da hbil, puede llamar/localizar a su doctor(a) al nmero que aparece a continuacin.   Por favor, tenga en cuenta que aunque hacemos todo lo posible para estar disponibles para asuntos urgentes fuera del horario de oficina, no estamos disponibles las 24 horas del da, los 7 das de la semana.   Si tiene un problema urgente y no puede comunicarse con nosotros, puede optar por buscar atencin mdica  en el consultorio de su doctor(a), en una clnica privada, en un centro de atencin urgente o en una sala de emergencias.  Si tiene una emergencia mdica, por favor llame inmediatamente al 911 o vaya a la sala de emergencias.  Nmeros de bper  - Dr. Kowalski: [phone]  - Dra. Moye: [phone]  - Dra. Stewart: [phone]  En caso de inclemencias del tiempo, por favor llame a nuestra lnea principal al [phone] para una actualizacin sobre el estado de cualquier retraso o cierre.  Consejos para la medicacin en dermatologa: Por favor, guarde las cajas en las que vienen los medicamentos de uso tpico para ayudarle a seguir las instrucciones sobre dnde y cmo usarlos. Las farmacias generalmente imprimen las instrucciones del medicamento slo en las cajas y no directamente en los tubos del medicamento.   Si su medicamento es muy caro, por favor, pngase en contacto con nuestra oficina llamando al [phone] y presione la opcin 4 o envenos un mensaje a travs de MyChart.   No podemos decirle cul ser su copago por los medicamentos por adelantado ya que esto es diferente dependiendo de la cobertura de su seguro. Sin embargo, es posible que podamos encontrar un medicamento sustituto a menor costo o llenar un formulario para que el  seguro cubra el medicamento que se considera necesario.   Si se requiere una autorizacin previa para que su compaa de seguros cubra su medicamento, por favor permtanos de 1 a 2 das hbiles para completar este proceso.  Los precios de los medicamentos varan con frecuencia dependiendo del lugar de dnde se surte la receta y alguna farmacias pueden ofrecer precios ms baratos.  El sitio web www.goodrx.com tiene cupones para medicamentos de diferentes farmacias. Los precios aqu no tienen en cuenta lo que podra costar con la ayuda del seguro (puede ser ms barato con su seguro), pero el sitio web puede darle el precio si no utiliz ningn seguro.  - Puede imprimir el cupn correspondiente y llevarlo con su receta a la farmacia.  - Tambin puede pasar por nuestra oficina durante el horario de atencin regular y recoger una tarjeta de cupones de GoodRx.  - Si necesita que su receta se enve electrnicamente a una farmacia diferente, informe a nuestra oficina a travs de MyChart de [place]   o por telfono llamando al [phone] y presione la opcin 4.  

## 2022-04-18 NOTE — Progress Notes (Signed)
   Follow-Up Visit   Subjective  Breanna Nguyen is a 26 y.o. female who presents for the following: Dermatitis (2 weeks f/u atopic dermatitis ). Improving on Dupixent treatment.  Mother with patient and contributes to history.  The following portions of the chart were reviewed this encounter and updated as appropriate:   Tobacco  Allergies  Meds  Problems  Med Hx  Surg Hx  Fam Hx     Review of Systems:  No other skin or systemic complaints except as noted in HPI or Assessment and Plan.  Objective  Well appearing patient in no apparent distress; mood and affect are within normal limits.  A focused examination was performed including trunk, exts. Relevant physical exam findings are noted in the Assessment and Plan.  trunk, exts Pinkness of left antecubital, left postauricular, fingertips, antecubital areas -improved today   Assessment & Plan  Atopic neurodermatitis trunk, exts Chronic and persistent condition with duration or expected duration over one year. Condition is symptomatic / bothersome to patient. Not to goal, but improving.  Worsened by water and fragrances - impacts aspects of daily living, difficult to give daughter baths, wash dishes, and bathe herself. Chronic and persistent condition with duration or expected duration over one year. Condition is bothersome/symptomatic for patient.      Atopic dermatitis (eczema) is a chronic, relapsing, pruritic condition that can significantly affect quality of life. It is often associated with allergic rhinitis and/or asthma and can require treatment with topical medications, phototherapy, or in severe cases biologic injectable medication (Dupixent; Adbry) or Oral JAK inhibitors.   Dupilumab (Dupixent) is a treatment given by injection for adults and children with moderate-to-severe atopic dermatitis. Goal is control of skin condition, not cure. It is given as 2 injections at the first dose followed by 1 injection ever 2 weeks  thereafter.  Young children are dosed monthly.   Discussed and recommend Dupixent injections. Potential side effects include allergic reaction, herpes infections, injection site reactions and conjunctivitis (inflammation of the eyes).  The use of Dupixent requires long term medication management, including periodic office visits.   Dupixent 300mg /20mL injected SQ to right upper arm. Patient tolerated injection well. Will plan to use samples until Dupixent is approved by insurance. Lot # [id number] Exp 04/2024   Continue Protopic 0.1% ointment to aa's face BID PRN.    Continue Clobetasol 0.05% cream to aa's BID PRN. Topical steroids (such as triamcinolone, fluocinolone, fluocinonide, mometasone, clobetasol, halobetasol, betamethasone, hydrocortisone) can cause thinning and lightening of the skin if they are used for too long in the same area. Your physician has selected the right strength medicine for your problem and area affected on the body. Please use your medication only as directed by your physician to prevent side effects.   Continue Mometasone cream qd-bid   Appeal [facility]ent to to [facility] today   Related Medications Dupilumab SOPN 300 mg   Return in about 2 weeks (around 05/02/2022) for [place] .  IMarye Round, CMA, am acting as scribe for Sarina Ser, MD .  Documentation: I have reviewed the above documentation for accuracy and completeness, and I agree with the above.  Sarina Ser, MD

## 2022-04-22 NOTE — Telephone Encounter (Signed)
Note not needed 

## 2022-05-01 ENCOUNTER — Encounter: Payer: Self-pay | Admitting: Nurse Practitioner

## 2022-05-01 ENCOUNTER — Ambulatory Visit (INDEPENDENT_AMBULATORY_CARE_PROVIDER_SITE_OTHER): Payer: Medicaid Other | Admitting: Nurse Practitioner

## 2022-05-01 VITALS — BP 101/59 | HR 52 | Ht 62.0 in | Wt 138.4 lb

## 2022-05-01 DIAGNOSIS — F411 Generalized anxiety disorder: Secondary | ICD-10-CM

## 2022-05-01 DIAGNOSIS — L2084 Intrinsic (allergic) eczema: Secondary | ICD-10-CM

## 2022-05-01 MED ORDER — SERTRALINE HCL 50 MG PO TABS: 50.0000 mg | ORAL_TABLET | Freq: Every day | ORAL | 3 refills | Status: DC

## 2022-05-01 NOTE — Progress Notes (Signed)
Established patient visit   Patient: Breanna Nguyen   DOB: Nov 19, 1995   26 y.o. Female  MRN: [id number] Visit Date: 05/01/2022   Chief Complaint  Patient presents with   Anxiety   Subjective    Anxiety Symptoms include nervous/anxious behavior. Patient reports no chest pain, dizziness, nausea, palpitations or shortness of breath.      Acute visit -feeling overwhelmed -having panic attacks  -gets short of breath  -breaks out into hives  -having increased personal stress -used to have some panic attacks off and on, but has always been able to get through them . They were intermittent with long periods of time in between -happening at least once weekly now. Starts the week off OK then gradually gets worse starting on Wednesday and Thursday.   -has been able to see dermatology for severe eczema. Started on Dupixent and is doing very well. Injections are every other week     Medications: Outpatient Medications Prior to Visit  Medication Sig   clobetasol cream (TEMOVATE) 0.05 % Apply 1 Application topically 2 (two) times daily.   Dupilumab (DUPIXENT) 300 MG/2ML SOPN Starting at day 15 for maintenance inject one pen SQ Q2W.   mometasone (ELOCON) 0.1 % cream Apply 1 Application topically 2 (two) times daily. Apply twice a day to affected areas as needed up to 5 days a week.   tacrolimus (PROTOPIC) 0.1 % ointment Apply to aa's eczema BID.   hydrOXYzine (ATARAX) 25 MG tablet Take 1/2 to 1 tablet po Bid prn itching (Patient not taking: Reported on 03/19/2022)   [DISCONTINUED] predniSONE (STERAPRED UNI-PAK 48 TAB) 10 MG (48) TBPK tablet 12 day taper - take by mouth as directed for 12 days (Patient not taking: Reported on 03/19/2022)   No facility-administered medications prior to visit.    Review of Systems  Constitutional:  Negative for activity change, appetite change, chills, fatigue and fever.  HENT:  Negative for congestion, postnasal drip, rhinorrhea, sinus pressure, sinus pain,  sneezing and sore throat.   Eyes: Negative.   Respiratory:  Negative for cough, chest tightness, shortness of breath and wheezing.   Cardiovascular:  Negative for chest pain and palpitations.  Gastrointestinal:  Negative for abdominal pain, constipation, diarrhea, nausea and vomiting.  Endocrine: Negative for cold intolerance, heat intolerance, polydipsia and polyuria.  Genitourinary:  Negative for dyspareunia, dysuria, flank pain, frequency and urgency.  Musculoskeletal:  Negative for arthralgias, back pain and myalgias.  Skin:  Negative for rash.       Improved eczema since seeing dermatology and starting on Dupixent   Allergic/Immunologic: Negative for environmental allergies.  Neurological:  Negative for dizziness, weakness and headaches.  Hematological:  Negative for adenopathy.  Psychiatric/Behavioral:  Positive for dysphoric mood. The patient is nervous/anxious.       Objective     Today's Vitals   05/01/22 1310  BP: (Abnormal) 101/59  Pulse: (Abnormal) 52  SpO2: 100%  Weight: 138 lb 6.4 oz (62.8 kg)  Height: 5\' 2"  (1.575 m)   Body mass index is 25.31 kg/m.  BP Readings from Last 3 Encounters:  05/01/22 (Abnormal) 101/59  03/18/22 97/61  02/25/22 (Abnormal) 90/57    Wt Readings from Last 3 Encounters:  05/01/22 138 lb 6.4 oz (62.8 kg)  03/18/22 142 lb 6.4 oz (64.6 kg)  02/25/22 145 lb 1.9 oz (65.8 kg)    Physical Exam Vitals and nursing note reviewed.  Constitutional:      Appearance: Normal appearance. She is well-developed.  HENT:  Head: Normocephalic and atraumatic.     Nose: Nose normal.     Mouth/Throat:     Mouth: Mucous membranes are moist.     Pharynx: Oropharynx is clear.  Eyes:     Extraocular Movements: Extraocular movements intact.     Conjunctiva/sclera: Conjunctivae normal.     Pupils: Pupils are equal, round, and reactive to light.  Cardiovascular:     Rate and Rhythm: Normal rate and regular rhythm.     Pulses: Normal pulses.      Heart sounds: Normal heart sounds.  Pulmonary:     Effort: Pulmonary effort is normal.     Breath sounds: Normal breath sounds.  Abdominal:     Palpations: Abdomen is soft.  Musculoskeletal:        General: Normal range of motion.     Cervical back: Normal range of motion and neck supple.  Lymphadenopathy:     Cervical: No cervical adenopathy.  Skin:    General: Skin is warm and dry.     Capillary Refill: Capillary refill takes less than 2 seconds.  Neurological:     General: No focal deficit present.     Mental Status: She is alert and oriented to person, place, and time.  Psychiatric:        Attention and Perception: Attention and perception normal.        Mood and Affect: Mood is anxious and depressed.        Speech: Speech normal.        Behavior: Behavior normal. Behavior is cooperative.        Thought Content: Thought content normal.        Cognition and Memory: Cognition and memory normal.        Judgment: Judgment normal.       Assessment & Plan    1. Generalized anxiety disorder Start sertraline 50 mg daily. May take hydrozyzine 25 mg, 1/2 to 1 tablet, twice daily as needed for acute anxiety.  - sertraline (ZOLOFT) 50 MG tablet; Take 1 tablet (50 mg total) by mouth daily.  Dispense: 30 tablet; Refill: 3  2. Intrinsic eczema Now seeing dermatology and taking Dupixent. Continue as scheduled    Problem List Items Addressed This Visit       Musculoskeletal and Integument   Eczema     Other   Generalized anxiety disorder - Primary   Relevant Medications   sertraline (ZOLOFT) 50 MG tablet     Return in about 4 weeks (around 05/29/2022) for mood. started sertraline .         Ronnell Freshwater, NP  [facility] [phone] (phone) [phone] (fax)  [place]

## 2022-05-02 ENCOUNTER — Encounter: Payer: Self-pay | Admitting: Dermatology

## 2022-05-02 ENCOUNTER — Ambulatory Visit (INDEPENDENT_AMBULATORY_CARE_PROVIDER_SITE_OTHER): Payer: Medicaid Other | Admitting: Dermatology

## 2022-05-02 DIAGNOSIS — L2081 Atopic neurodermatitis: Secondary | ICD-10-CM | POA: Diagnosis not present

## 2022-05-02 DIAGNOSIS — Z79899 Other long term (current) drug therapy: Secondary | ICD-10-CM | POA: Diagnosis not present

## 2022-05-02 MED ORDER — DUPILUMAB 300 MG/2ML ~~LOC~~ SOAJ
300.0000 mg | Freq: Once | SUBCUTANEOUS | Status: AC
  Administered 2022-05-02: 300 mg via SUBCUTANEOUS

## 2022-05-02 NOTE — Patient Instructions (Signed)
Due to recent changes in healthcare laws, you may see results of your pathology and/or laboratory studies on MyChart before the doctors have had a chance to review them. We understand that in some cases there may be results that are confusing or concerning to you. Please understand that not all results are received at the same time and often the doctors may need to interpret multiple results in order to provide you with the best plan of care or course of treatment. Therefore, we ask that you please give us 2 business days to thoroughly review all your results before contacting the office for clarification. Should we see a critical lab result, you will be contacted sooner.   If You Need Anything After Your Visit  If you have any questions or concerns for your doctor, please call our main line at [phone] and press option 4 to reach your doctor's medical assistant. If no one answers, please leave a voicemail as directed and we will return your call as soon as possible. Messages left after 4 pm will be answered the following business day.   You may also send us a message via MyChart. We typically respond to MyChart messages within 1-2 business days.  For prescription refills, please ask your pharmacy to contact our office. Our fax number is [phone].  If you have an urgent issue when the clinic is closed that cannot wait until the next business day, you can page your doctor at the number below.    Please note that while we do our best to be available for urgent issues outside of office hours, we are not available 24/7.   If you have an urgent issue and are unable to reach us, you may choose to seek medical care at your doctor's office, retail clinic, urgent care center, or emergency room.  If you have a medical emergency, please immediately call 911 or go to the emergency department.  Pager Numbers  - Dr. Kowalski: [phone]  - Dr. Moye: [phone]  - Dr. Stewart:  [phone]  In the event of inclement weather, please call our main line at [phone] for an update on the status of any delays or closures.  Dermatology Medication Tips: Please keep the boxes that topical medications come in in order to help keep track of the instructions about where and how to use these. Pharmacies typically print the medication instructions only on the boxes and not directly on the medication tubes.   If your medication is too expensive, please contact our office at [phone] option 4 or send us a message through MyChart.   We are unable to tell what your co-pay for medications will be in advance as this is different depending on your insurance coverage. However, we may be able to find a substitute medication at lower cost or fill out paperwork to get insurance to cover a needed medication.   If a prior authorization is required to get your medication covered by your insurance company, please allow us 1-2 business days to complete this process.  Drug prices often vary depending on where the prescription is filled and some pharmacies may offer cheaper prices.  The website www.goodrx.com contains coupons for medications through different pharmacies. The prices here do not account for what the cost may be with help from insurance (it may be cheaper with your insurance), but the website can give you the price if you did not use any insurance.  - You can print the associated coupon and take it with   your prescription to the pharmacy.  - You may also stop by our office during regular business hours and pick up a GoodRx coupon card.  - If you need your prescription sent electronically to a different pharmacy, notify our office through [facility] MyChart or by phone at [phone] option 4.     Si Usted Necesita Algo Despus de Su Visita  Tambin puede enviarnos un mensaje a travs de MyChart. Por lo general respondemos a los mensajes de MyChart en el transcurso de 1 a 2  das hbiles.  Para renovar recetas, por favor pida a su farmacia que se ponga en contacto con nuestra oficina. Nuestro nmero de fax es el [phone].  Si tiene un asunto urgente cuando la clnica est cerrada y que no puede esperar hasta el siguiente da hbil, puede llamar/localizar a su doctor(a) al nmero que aparece a continuacin.   Por favor, tenga en cuenta que aunque hacemos todo lo posible para estar disponibles para asuntos urgentes fuera del horario de oficina, no estamos disponibles las 24 horas del da, los 7 das de la semana.   Si tiene un problema urgente y no puede comunicarse con nosotros, puede optar por buscar atencin mdica  en el consultorio de su doctor(a), en una clnica privada, en un centro de atencin urgente o en una sala de emergencias.  Si tiene una emergencia mdica, por favor llame inmediatamente al 911 o vaya a la sala de emergencias.  Nmeros de bper  - Dr. Kowalski: [phone]  - Dra. Moye: [phone]  - Dra. Stewart: [phone]  En caso de inclemencias del tiempo, por favor llame a nuestra lnea principal al [phone] para una actualizacin sobre el estado de cualquier retraso o cierre.  Consejos para la medicacin en dermatologa: Por favor, guarde las cajas en las que vienen los medicamentos de uso tpico para ayudarle a seguir las instrucciones sobre dnde y cmo usarlos. Las farmacias generalmente imprimen las instrucciones del medicamento slo en las cajas y no directamente en los tubos del medicamento.   Si su medicamento es muy caro, por favor, pngase en contacto con nuestra oficina llamando al [phone] y presione la opcin 4 o envenos un mensaje a travs de MyChart.   No podemos decirle cul ser su copago por los medicamentos por adelantado ya que esto es diferente dependiendo de la cobertura de su seguro. Sin embargo, es posible que podamos encontrar un medicamento sustituto a menor costo o llenar un formulario para que el  seguro cubra el medicamento que se considera necesario.   Si se requiere una autorizacin previa para que su compaa de seguros cubra su medicamento, por favor permtanos de 1 a 2 das hbiles para completar este proceso.  Los precios de los medicamentos varan con frecuencia dependiendo del lugar de dnde se surte la receta y alguna farmacias pueden ofrecer precios ms baratos.  El sitio web www.goodrx.com tiene cupones para medicamentos de diferentes farmacias. Los precios aqu no tienen en cuenta lo que podra costar con la ayuda del seguro (puede ser ms barato con su seguro), pero el sitio web puede darle el precio si no utiliz ningn seguro.  - Puede imprimir el cupn correspondiente y llevarlo con su receta a la farmacia.  - Tambin puede pasar por nuestra oficina durante el horario de atencin regular y recoger una tarjeta de cupones de GoodRx.  - Si necesita que su receta se enve electrnicamente a una farmacia diferente, informe a nuestra oficina a travs de MyChart de [place]   o por telfono llamando al [phone] y presione la opcin 4.  

## 2022-05-02 NOTE — Progress Notes (Signed)
   Follow-Up Visit   Subjective  Breanna Nguyen is a 26 y.o. female who presents for the following: Dermatitis (2 weeks f/u Atopic dermatitis, treating with Dupixent injection every 2 weeks with a good response, patient report she is not longer itching and her skin is improving. Patient has had 3 Dupxient injections).  Mother with patient and contributes to history.  The following portions of the chart were reviewed this encounter and updated as appropriate:   Tobacco  Allergies  Meds  Problems  Med Hx  Surg Hx  Fam Hx     Review of Systems:  No other skin or systemic complaints except as noted in HPI or Assessment and Plan.  Objective  Well appearing patient in no apparent distress; mood and affect are within normal limits.  A focused examination was performed including arms,legs. Relevant physical exam findings are noted in the Assessment and Plan.  arms, legs Pinkness of left antecubital, left postauricular, fingertips, antecubital areas    Assessment & Plan  Atopic neurodermatitis arms, legs Other atopic dermatitis   Worsened by water and fragrances - impacts aspects of daily living, difficult to give daughter baths, wash dishes, and bathe herself. Chronic and persistent condition with duration or expected duration over one year. Condition is bothersome/symptomatic for patient.     Atopic dermatitis (eczema) is a chronic, relapsing, pruritic condition that can significantly affect quality of life. It is often associated with allergic rhinitis and/or asthma and can require treatment with topical medications, phototherapy, or in severe cases biologic injectable medication (Dupixent; Adbry) or Oral JAK inhibitors.   Dupilumab (Dupixent) is a treatment given by injection for adults and children with moderate-to-severe atopic dermatitis. Goal is control of skin condition, not cure. It is given as 2 injections at the first dose followed by 1 injection ever 2 weeks thereafter.   Young children are dosed monthly.   Discussed and recommend Dupixent injections. Potential side effects include allergic reaction, herpes infections, injection site reactions and conjunctivitis (inflammation of the eyes).  The use of Dupixent requires long term medication management, including periodic office visits.   Dupixent 300mg /20mL injected SQ to left upper arm. Patient tolerated injection well. Will plan to use samples until Dupixent is approved by insurance. Lot # [id number] Exp 03/2024   Continue Protopic 0.1% ointment to aa's face BID PRN.    Continue Clobetasol 0.05% cream to aa's BID PRN. Topical steroids (such as triamcinolone, fluocinolone, fluocinonide, mometasone, clobetasol, halobetasol, betamethasone, hydrocortisone) can cause thinning and lightening of the skin if they are used for too long in the same area. Your physician has selected the right strength medicine for your problem and area affected on the body. Please use your medication only as directed by your physician to prevent side effects.    Continue Mometasone cream qd-bid    Dupxient prescription in appeals we will continue giving Dupxient samples until prescription has been approved  Dupilumab SOPN 300 mg - arms, legs  Return in about 2 weeks (around 05/16/2022) for [place].  IMarye Round, CMA, am acting as scribe for Sarina Ser, MD .  Documentation: I have reviewed the above documentation for accuracy and completeness, and I agree with the above.  Sarina Ser, MD

## 2022-05-07 ENCOUNTER — Encounter: Payer: Self-pay | Admitting: Dermatology

## 2022-05-16 ENCOUNTER — Ambulatory Visit (INDEPENDENT_AMBULATORY_CARE_PROVIDER_SITE_OTHER): Payer: Medicaid Other | Admitting: Dermatology

## 2022-05-16 DIAGNOSIS — Z79899 Other long term (current) drug therapy: Secondary | ICD-10-CM

## 2022-05-16 DIAGNOSIS — L209 Atopic dermatitis, unspecified: Secondary | ICD-10-CM | POA: Diagnosis not present

## 2022-05-16 MED ORDER — DUPIXENT 300 MG/2ML ~~LOC~~ SOSY: 300.0000 mg | PREFILLED_SYRINGE | SUBCUTANEOUS | 0 refills | Status: DC

## 2022-05-16 NOTE — Patient Instructions (Addendum)
Dupilumab (Dupixent) is a treatment given by injection for adults and children with moderate-to-severe atopic dermatitis. Goal is control of skin condition, not cure. It is given as 2 injections at the first dose followed by 1 injection ever 2 weeks thereafter.  Young children are dosed monthly.  Potential side effects include allergic reaction, herpes infections, injection site reactions and conjunctivitis (inflammation of the eyes).  The use of Dupixent requires long term medication management, including periodic office visits.    Due to recent changes in healthcare laws, you may see results of your pathology and/or laboratory studies on MyChart before the doctors have had a chance to review them. We understand that in some cases there may be results that are confusing or concerning to you. Please understand that not all results are received at the same time and often the doctors may need to interpret multiple results in order to provide you with the best plan of care or course of treatment. Therefore, we ask that you please give us 2 business days to thoroughly review all your results before contacting the office for clarification. Should we see a critical lab result, you will be contacted sooner.   If You Need Anything After Your Visit  If you have any questions or concerns for your doctor, please call our main line at [phone] and press option 4 to reach your doctor's medical assistant. If no one answers, please leave a voicemail as directed and we will return your call as soon as possible. Messages left after 4 pm will be answered the following business day.   You may also send us a message via MyChart. We typically respond to MyChart messages within 1-2 business days.  For prescription refills, please ask your pharmacy to contact our office. Our fax number is [phone].  If you have an urgent issue when the clinic is closed that cannot wait until the next business day, you can page  your doctor at the number below.    Please note that while we do our best to be available for urgent issues outside of office hours, we are not available 24/7.   If you have an urgent issue and are unable to reach us, you may choose to seek medical care at your doctor's office, retail clinic, urgent care center, or emergency room.  If you have a medical emergency, please immediately call 911 or go to the emergency department.  Pager Numbers  - Dr. Kowalski: [phone]  - Dr. Moye: [phone]  - Dr. Stewart: [phone]  In the event of inclement weather, please call our main line at [phone] for an update on the status of any delays or closures.  Dermatology Medication Tips: Please keep the boxes that topical medications come in in order to help keep track of the instructions about where and how to use these. Pharmacies typically print the medication instructions only on the boxes and not directly on the medication tubes.   If your medication is too expensive, please contact our office at [phone] option 4 or send us a message through MyChart.   We are unable to tell what your co-pay for medications will be in advance as this is different depending on your insurance coverage. However, we may be able to find a substitute medication at lower cost or fill out paperwork to get insurance to cover a needed medication.   If a prior authorization is required to get your medication covered by your insurance company, please allow us 1-2 business days to   complete this process.  Drug prices often vary depending on where the prescription is filled and some pharmacies may offer cheaper prices.  The website www.goodrx.com contains coupons for medications through different pharmacies. The prices here do not account for what the cost may be with help from insurance (it may be cheaper with your insurance), but the website can give you the price if you did not use any insurance.  - You can  print the associated coupon and take it with your prescription to the pharmacy.  - You may also stop by our office during regular business hours and pick up a GoodRx coupon card.  - If you need your prescription sent electronically to a different pharmacy, notify our office through [facility] MyChart or by phone at [phone] option 4.     Si Usted Necesita Algo Despus de Su Visita  Tambin puede enviarnos un mensaje a travs de MyChart. Por lo general respondemos a los mensajes de MyChart en el transcurso de 1 a 2 das hbiles.  Para renovar recetas, por favor pida a su farmacia que se ponga en contacto con nuestra oficina. Nuestro nmero de fax es el [phone].  Si tiene un asunto urgente cuando la clnica est cerrada y que no puede esperar hasta el siguiente da hbil, puede llamar/localizar a su doctor(a) al nmero que aparece a continuacin.   Por favor, tenga en cuenta que aunque hacemos todo lo posible para estar disponibles para asuntos urgentes fuera del horario de oficina, no estamos disponibles las 24 horas del da, los 7 das de la semana.   Si tiene un problema urgente y no puede comunicarse con nosotros, puede optar por buscar atencin mdica  en el consultorio de su doctor(a), en una clnica privada, en un centro de atencin urgente o en una sala de emergencias.  Si tiene una emergencia mdica, por favor llame inmediatamente al 911 o vaya a la sala de emergencias.  Nmeros de bper  - Dr. Kowalski: [phone]  - Dra. Moye: [phone]  - Dra. Stewart: [phone]  En caso de inclemencias del tiempo, por favor llame a nuestra lnea principal al [phone] para una actualizacin sobre el estado de cualquier retraso o cierre.  Consejos para la medicacin en dermatologa: Por favor, guarde las cajas en las que vienen los medicamentos de uso tpico para ayudarle a seguir las instrucciones sobre dnde y cmo usarlos. Las farmacias generalmente imprimen las  instrucciones del medicamento slo en las cajas y no directamente en los tubos del medicamento.   Si su medicamento es muy caro, por favor, pngase en contacto con nuestra oficina llamando al [phone] y presione la opcin 4 o envenos un mensaje a travs de MyChart.   No podemos decirle cul ser su copago por los medicamentos por adelantado ya que esto es diferente dependiendo de la cobertura de su seguro. Sin embargo, es posible que podamos encontrar un medicamento sustituto a menor costo o llenar un formulario para que el seguro cubra el medicamento que se considera necesario.   Si se requiere una autorizacin previa para que su compaa de seguros cubra su medicamento, por favor permtanos de 1 a 2 das hbiles para completar este proceso.  Los precios de los medicamentos varan con frecuencia dependiendo del lugar de dnde se surte la receta y alguna farmacias pueden ofrecer precios ms baratos.  El sitio web www.goodrx.com tiene cupones para medicamentos de diferentes farmacias. Los precios aqu no tienen en cuenta lo que podra costar con la ayuda del   seguro (puede ser ms barato con su seguro), pero el sitio web puede darle el precio si no utiliz ningn seguro.  - Puede imprimir el cupn correspondiente y llevarlo con su receta a la farmacia.  - Tambin puede pasar por nuestra oficina durante el horario de atencin regular y recoger una tarjeta de cupones de GoodRx.  - Si necesita que su receta se enve electrnicamente a una farmacia diferente, informe a nuestra oficina a travs de MyChart de [place] o por telfono llamando al [phone] y presione la opcin 4.  

## 2022-05-16 NOTE — Progress Notes (Signed)
   Follow-Up Visit   Subjective  Breanna Nguyen is a 26 y.o. female who presents for the following: Eczema (Patient reports some improvement since last visit. No active areas. ).  The following portions of the chart were reviewed this encounter and updated as appropriate:  Tobacco  Allergies  Meds  Problems  Med Hx  Surg Hx  Fam Hx     Review of Systems: No other skin or systemic complaints except as noted in HPI or Assessment and Plan.  Objective  Well appearing patient in no apparent distress; mood and affect are within normal limits.  All skin waist up examined.  arm and legs Clear at exam    Assessment & Plan  Atopic dermatitis, severe  arm and legs Hx Worsened by water and fragrances - impacts aspects of daily living, difficult to give daughter baths, wash dishes, and bathe herself. Chronic and persistent condition with duration or expected duration over one year. Condition is bothersome/symptomatic for patient.     Atopic dermatitis (eczema) is a chronic, relapsing, pruritic condition that can significantly affect quality of life. It is often associated with allergic rhinitis and/or asthma and can require treatment with topical medications, phototherapy, or in severe cases biologic injectable medication (Dupixent; Adbry) or Oral JAK inhibitors.   Dupilumab (Dupixent) is a treatment given by injection for adults and children with moderate-to-severe atopic dermatitis. Goal is control of skin condition, not cure. It is given as 2 injections at the first dose followed by 1 injection ever 2 weeks thereafter.  Young children are dosed monthly.   Discussed and recommend Dupixent injections. Potential side effects include allergic reaction, herpes infections, injection site reactions and conjunctivitis (inflammation of the eyes).  The use of Dupixent requires long term medication management, including periodic office visits.   Dupixent 300mg /25mL injected SQ to right upper arm.  Patient tolerated injection well. Will plan to use samples until Dupixent is approved by insurance. Lot # [id number] Exp 04/2024 NDC 05/2024   Continue Protopic 0.1% ointment to aa's face BID PRN.    Continue Clobetasol 0.05% cream to aa's BID PRN. Topical steroids (such as triamcinolone, fluocinolone, fluocinonide, mometasone, clobetasol, halobetasol, betamethasone, hydrocortisone) can cause thinning and lightening of the skin if they are used for too long in the same area. Your physician has selected the right strength medicine for your problem and area affected on the body. Please use your medication only as directed by your physician to prevent side effects.    Continue Mometasone cream qd-bid   Patient approved for Dupixent, paperwork faxed to [facility], went over patient education on how to self inject using Dupxient pens.   dupilumab (DUPIXENT) 300 MG/2ML prefilled syringe - arm and legs Inject 300 mg into the skin every 14 (fourteen) days. Starting at day 15 for maintenance.  Return in about 3 months (around 08/16/2022) for atopic derm . I4/03/2023, CMA, am acting as scribe for Asher Muir, MD. Documentation: I have reviewed the above documentation for accuracy and completeness, and I agree with the above.  Armida Sans, MD

## 2022-05-25 ENCOUNTER — Encounter: Payer: Self-pay | Admitting: Dermatology

## 2022-05-29 ENCOUNTER — Encounter: Payer: Self-pay | Admitting: Nurse Practitioner

## 2022-05-29 ENCOUNTER — Ambulatory Visit (INDEPENDENT_AMBULATORY_CARE_PROVIDER_SITE_OTHER): Payer: Medicaid Other | Admitting: Nurse Practitioner

## 2022-05-29 VITALS — BP 124/83 | HR 56 | Resp 18 | Ht 62.0 in | Wt 135.0 lb

## 2022-05-29 DIAGNOSIS — R21 Rash and other nonspecific skin eruption: Secondary | ICD-10-CM

## 2022-05-29 DIAGNOSIS — F410 Panic disorder [episodic paroxysmal anxiety] without agoraphobia: Secondary | ICD-10-CM

## 2022-05-29 DIAGNOSIS — F411 Generalized anxiety disorder: Secondary | ICD-10-CM

## 2022-05-29 MED ORDER — HYDROXYZINE HCL 25 MG PO TABS: ORAL_TABLET | ORAL | 2 refills | Status: DC

## 2022-05-29 MED ORDER — ESCITALOPRAM OXALATE 10 MG PO TABS: 10.0000 mg | ORAL_TABLET | Freq: Every day | ORAL | 3 refills | Status: DC

## 2022-05-29 NOTE — Progress Notes (Signed)
Established patient visit   Patient: Breanna Nguyen   DOB: 1995/07/10   26 y.o. Female  MRN: [id number] Visit Date: 05/29/2022   Chief Complaint  Patient presents with   Anxiety   Subjective    HPI  Follow up  -started sertraline 50 mg daily.  --not helping with worry or anxiety at all. She doesn't feel as though she is taking anything at all.  -denies negative side effects of medication.    Medications: Outpatient Medications Prior to Visit  Medication Sig   clobetasol cream (TEMOVATE) 0.05 % Apply 1 Application topically 2 (two) times daily.   dupilumab (DUPIXENT) 300 MG/2ML prefilled syringe Inject 300 mg into the skin every 14 (fourteen) days. Starting at day 15 for maintenance.   mometasone (ELOCON) 0.1 % cream Apply 1 Application topically 2 (two) times daily. Apply twice a day to affected areas as needed up to 5 days a week.   [DISCONTINUED] hydrOXYzine (ATARAX) 25 MG tablet Take 1/2 to 1 tablet po Bid prn itching   [DISCONTINUED] sertraline (ZOLOFT) 50 MG tablet Take 1 tablet (50 mg total) by mouth daily.   No facility-administered medications prior to visit.    Review of Systems  Constitutional:  Positive for fatigue. Negative for activity change, appetite change, chills and fever.  HENT:  Negative for congestion, postnasal drip, rhinorrhea, sinus pressure, sinus pain, sneezing and sore throat.   Eyes: Negative.   Respiratory:  Negative for cough, chest tightness, shortness of breath and wheezing.   Cardiovascular:  Negative for chest pain and palpitations.  Gastrointestinal:  Negative for abdominal pain, constipation, diarrhea, nausea and vomiting.  Endocrine: Negative for cold intolerance, heat intolerance, polydipsia and polyuria.  Genitourinary:  Negative for dyspareunia, dysuria, flank pain, frequency and urgency.  Musculoskeletal:  Negative for arthralgias, back pain and myalgias.  Skin:  Negative for rash.  Allergic/Immunologic: Negative for environmental  allergies.  Neurological:  Negative for dizziness, weakness and headaches.  Hematological:  Negative for adenopathy.  Psychiatric/Behavioral:  Positive for dysphoric mood and sleep disturbance. The patient is nervous/anxious.        Objective     Today's Vitals   05/29/22 1107  BP: 124/83  Pulse: (Abnormal) 56  Resp: 18  SpO2: 97%  Weight: 135 lb (61.2 kg)  Height: 5\' 2"  (1.575 m)   Body mass index is 24.69 kg/m.  BP Readings from Last 3 Encounters:  06/06/22 (Abnormal) 111/99  05/29/22 124/83  05/01/22 (Abnormal) 101/59    Wt Readings from Last 3 Encounters:  05/29/22 135 lb (61.2 kg)  05/01/22 138 lb 6.4 oz (62.8 kg)  03/18/22 142 lb 6.4 oz (64.6 kg)    Physical Exam Vitals and nursing note reviewed.  Constitutional:      Appearance: Normal appearance. She is well-developed.  HENT:     Head: Normocephalic and atraumatic.     Nose: Nose normal.     Mouth/Throat:     Mouth: Mucous membranes are moist.     Pharynx: Oropharynx is clear.  Eyes:     Extraocular Movements: Extraocular movements intact.     Conjunctiva/sclera: Conjunctivae normal.     Pupils: Pupils are equal, round, and reactive to light.  Cardiovascular:     Rate and Rhythm: Normal rate and regular rhythm.     Pulses: Normal pulses.     Heart sounds: Normal heart sounds.  Pulmonary:     Effort: Pulmonary effort is normal.     Breath sounds: Normal breath sounds.  Abdominal:  Palpations: Abdomen is soft.  Musculoskeletal:        General: Normal range of motion.     Cervical back: Normal range of motion and neck supple.  Lymphadenopathy:     Cervical: No cervical adenopathy.  Skin:    General: Skin is warm and dry.     Capillary Refill: Capillary refill takes less than 2 seconds.  Neurological:     General: No focal deficit present.     Mental Status: She is alert and oriented to person, place, and time.  Psychiatric:        Attention and Perception: Attention and perception normal.         Mood and Affect: Mood is anxious and depressed.        Speech: Speech normal.        Behavior: Behavior normal.        Thought Content: Thought content normal.        Cognition and Memory: Cognition and memory normal.        Judgment: Judgment normal.      Assessment & Plan    1. Generalized anxiety disorder Change sertraline to lexapro 10 mg daily. Instructions to  switch medications reviewed with  the patient. She voiced understanding. May take atarax 25 mg, 1/2 to 1 tablet at bedtime as needed for itching and anxiety.  Reassess in 6 weeks.  - hydrOXYzine (ATARAX) 25 MG tablet; Take 1/2 to 1 tablet po Bid prn itching  Dispense: 45 tablet; Refill: 2 - escitalopram (LEXAPRO) 10 MG tablet; Take 1 tablet (10 mg total) by mouth daily.  Dispense: 30 tablet; Refill: 3  2. Panic attack May take atarax 25 mg, 1/2 to 1 tablet at bedtime as needed for itching and anxiety.  Reassess in 6 weeks.  - hydrOXYzine (ATARAX) 25 MG tablet; Take 1/2 to 1 tablet po Bid prn itching  Dispense: 45 tablet; Refill: 2  Problem List Items Addressed This Visit       Musculoskeletal and Integument   Rash and nonspecific skin eruption     Other   Generalized anxiety disorder - Primary   Relevant Medications   hydrOXYzine (ATARAX) 25 MG tablet   escitalopram (LEXAPRO) 10 MG tablet   Panic attack   Relevant Medications   hydrOXYzine (ATARAX) 25 MG tablet   escitalopram (LEXAPRO) 10 MG tablet     Return in about 6 weeks (around 07/10/2022) for mood.         Carlean Jews, NP  [facility] [phone] (phone) [phone] (fax)  [facility]

## 2022-06-06 ENCOUNTER — Ambulatory Visit (INDEPENDENT_AMBULATORY_CARE_PROVIDER_SITE_OTHER): Payer: Medicaid Other | Admitting: Dermatology

## 2022-06-06 VITALS — BP 111/99 | HR 66

## 2022-06-06 DIAGNOSIS — L299 Pruritus, unspecified: Secondary | ICD-10-CM | POA: Diagnosis not present

## 2022-06-06 DIAGNOSIS — Z79899 Other long term (current) drug therapy: Secondary | ICD-10-CM

## 2022-06-06 DIAGNOSIS — L2081 Atopic neurodermatitis: Secondary | ICD-10-CM | POA: Diagnosis not present

## 2022-06-06 MED ORDER — DUPIXENT 300 MG/2ML ~~LOC~~ SOSY: 300.0000 mg | PREFILLED_SYRINGE | SUBCUTANEOUS | 0 refills | Status: DC

## 2022-06-06 NOTE — Patient Instructions (Signed)
Due to recent changes in healthcare laws, you may see results of your pathology and/or laboratory studies on MyChart before the doctors have had a chance to review them. We understand that in some cases there may be results that are confusing or concerning to you. Please understand that not all results are received at the same time and often the doctors may need to interpret multiple results in order to provide you with the best plan of care or course of treatment. Therefore, we ask that you please give us 2 business days to thoroughly review all your results before contacting the office for clarification. Should we see a critical lab result, you will be contacted sooner.   If You Need Anything After Your Visit  If you have any questions or concerns for your doctor, please call our main line at [phone] and press option 4 to reach your doctor's medical assistant. If no one answers, please leave a voicemail as directed and we will return your call as soon as possible. Messages left after 4 pm will be answered the following business day.   You may also send us a message via MyChart. We typically respond to MyChart messages within 1-2 business days.  For prescription refills, please ask your pharmacy to contact our office. Our fax number is [phone].  If you have an urgent issue when the clinic is closed that cannot wait until the next business day, you can page your doctor at the number below.    Please note that while we do our best to be available for urgent issues outside of office hours, we are not available 24/7.   If you have an urgent issue and are unable to reach us, you may choose to seek medical care at your doctor's office, retail clinic, urgent care center, or emergency room.  If you have a medical emergency, please immediately call 911 or go to the emergency department.  Pager Numbers  - Dr. Kowalski: [phone]  - Dr. Moye: [phone]  - Dr. Stewart:  [phone]  In the event of inclement weather, please call our main line at [phone] for an update on the status of any delays or closures.  Dermatology Medication Tips: Please keep the boxes that topical medications come in in order to help keep track of the instructions about where and how to use these. Pharmacies typically print the medication instructions only on the boxes and not directly on the medication tubes.   If your medication is too expensive, please contact our office at [phone] option 4 or send us a message through MyChart.   We are unable to tell what your co-pay for medications will be in advance as this is different depending on your insurance coverage. However, we may be able to find a substitute medication at lower cost or fill out paperwork to get insurance to cover a needed medication.   If a prior authorization is required to get your medication covered by your insurance company, please allow us 1-2 business days to complete this process.  Drug prices often vary depending on where the prescription is filled and some pharmacies may offer cheaper prices.  The website www.goodrx.com contains coupons for medications through different pharmacies. The prices here do not account for what the cost may be with help from insurance (it may be cheaper with your insurance), but the website can give you the price if you did not use any insurance.  - You can print the associated coupon and take it with   your prescription to the pharmacy.  - You may also stop by our office during regular business hours and pick up a GoodRx coupon card.  - If you need your prescription sent electronically to a different pharmacy, notify our office through [place] MyChart or by phone at [phone] option 4.     Si Usted Necesita Algo Despus de Su Visita  Tambin puede enviarnos un mensaje a travs de MyChart. Por lo general respondemos a los mensajes de MyChart en el transcurso de 1 a 2  das hbiles.  Para renovar recetas, por favor pida a su farmacia que se ponga en contacto con nuestra oficina. Nuestro nmero de fax es el [phone].  Si tiene un asunto urgente cuando la clnica est cerrada y que no puede esperar hasta el siguiente da hbil, puede llamar/localizar a su doctor(a) al nmero que aparece a continuacin.   Por favor, tenga en cuenta que aunque hacemos todo lo posible para estar disponibles para asuntos urgentes fuera del horario de oficina, no estamos disponibles las 24 horas del da, los 7 das de la semana.   Si tiene un problema urgente y no puede comunicarse con nosotros, puede optar por buscar atencin mdica  en el consultorio de su doctor(a), en una clnica privada, en un centro de atencin urgente o en una sala de emergencias.  Si tiene una emergencia mdica, por favor llame inmediatamente al 911 o vaya a la sala de emergencias.  Nmeros de bper  - Dr. Kowalski: [phone]  - Dra. Moye: [phone]  - Dra. Stewart: [phone]  En caso de inclemencias del tiempo, por favor llame a nuestra lnea principal al [phone] para una actualizacin sobre el estado de cualquier retraso o cierre.  Consejos para la medicacin en dermatologa: Por favor, guarde las cajas en las que vienen los medicamentos de uso tpico para ayudarle a seguir las instrucciones sobre dnde y cmo usarlos. Las farmacias generalmente imprimen las instrucciones del medicamento slo en las cajas y no directamente en los tubos del medicamento.   Si su medicamento es muy caro, por favor, pngase en contacto con nuestra oficina llamando al [phone] y presione la opcin 4 o envenos un mensaje a travs de MyChart.   No podemos decirle cul ser su copago por los medicamentos por adelantado ya que esto es diferente dependiendo de la cobertura de su seguro. Sin embargo, es posible que podamos encontrar un medicamento sustituto a menor costo o llenar un formulario para que el  seguro cubra el medicamento que se considera necesario.   Si se requiere una autorizacin previa para que su compaa de seguros cubra su medicamento, por favor permtanos de 1 a 2 das hbiles para completar este proceso.  Los precios de los medicamentos varan con frecuencia dependiendo del lugar de dnde se surte la receta y alguna farmacias pueden ofrecer precios ms baratos.  El sitio web www.goodrx.com tiene cupones para medicamentos de diferentes farmacias. Los precios aqu no tienen en cuenta lo que podra costar con la ayuda del seguro (puede ser ms barato con su seguro), pero el sitio web puede darle el precio si no utiliz ningn seguro.  - Puede imprimir el cupn correspondiente y llevarlo con su receta a la farmacia.  - Tambin puede pasar por nuestra oficina durante el horario de atencin regular y recoger una tarjeta de cupones de GoodRx.  - Si necesita que su receta se enve electrnicamente a una farmacia diferente, informe a nuestra oficina a travs de MyChart de [place]   o por telfono llamando al [phone] y presione la opcin 4.  

## 2022-06-06 NOTE — Progress Notes (Signed)
   Follow-Up Visit   Subjective  Breanna Nguyen is a 26 y.o. female who presents for the following: Eczema (Arms, legs, 2wk f/u Dupixent sq injection 2x/wk, Clobetasol cr bid, Protopic 0.1% oint bid).  Patient accompanied by her mother  The following portions of the chart were reviewed this encounter and updated as appropriate:   Tobacco  Allergies  Meds  Problems  Med Hx  Surg Hx  Fam Hx     Review of Systems:  No other skin or systemic complaints except as noted in HPI or Assessment and Plan.  Objective  Well appearing patient in no apparent distress; mood and affect are within normal limits.  A focused examination was performed including face, arms. Relevant physical exam findings are noted in the Assessment and Plan.  trunk, extremities, face Arms clear   Assessment & Plan  Atopic neurodermatitis trunk, extremities, face  Severe Hx Worsened by water and fragrances - impacts aspects of daily living, difficult for patient to wash dishes, and bathe herself. Chronic and persistent condition with duration or expected duration over one year. Condition is bothersome/symptomatic for patient.     Atopic dermatitis - Severe, on Dupixent (biologic medication).  Atopic dermatitis (eczema) is a chronic, relapsing, pruritic condition that can significantly affect quality of life. It is often associated with allergic rhinitis and/or asthma and can require treatment with topical medications, phototherapy, or in severe cases a biologic medication called Dupixent, which requires long term medication management.    Cont Dupixent 300mg /3ml sq injections q 2 wks Injection today to L upper arm Lot 3m 09/2024 Cont Protopic 0.1% oint qd/bid aa eczema until clear, then prn flares Decrease Clobetasol cr to qd/bid up to 5d/wk aa eczema prn flares, avoid f/g/a  Dupilumab (Dupixent) is a treatment given by injection for adults and children with moderate-to-severe atopic dermatitis. Goal is  control of skin condition, not cure. It is given as 2 injections at the first dose followed by 1 injection ever 2 weeks thereafter.  Young children are dosed monthly.  Potential side effects include allergic reaction, herpes infections, injection site reactions and conjunctivitis (inflammation of the eyes).  The use of Dupixent requires long term medication management, including periodic office visits.   Topical steroids (such as triamcinolone, fluocinolone, fluocinonide, mometasone, clobetasol, halobetasol, betamethasone, hydrocortisone) can cause thinning and lightening of the skin if they are used for too long in the same area. Your physician has selected the right strength medicine for your problem and area affected on the body. Please use your medication only as directed by your physician to prevent side effects.    dupilumab (DUPIXENT) 300 MG/2ML prefilled syringe - trunk, extremities, face Inject 300 mg into the skin every 14 (fourteen) days. Starting at day 15 for maintenance.   Return in about 2 weeks (around 06/20/2022) for Atopic Derm.  I, 06/22/2022, RMA, am acting as scribe for Ardis Rowan, MD . Documentation: I have reviewed the above documentation for accuracy and completeness, and I agree with the above.  Armida Sans, MD

## 2022-06-09 DIAGNOSIS — F410 Panic disorder [episodic paroxysmal anxiety] without agoraphobia: Secondary | ICD-10-CM | POA: Insufficient documentation

## 2022-06-20 ENCOUNTER — Ambulatory Visit (INDEPENDENT_AMBULATORY_CARE_PROVIDER_SITE_OTHER): Payer: Medicaid Other | Admitting: Dermatology

## 2022-06-20 VITALS — BP 108/64 | HR 68

## 2022-06-20 DIAGNOSIS — L2081 Atopic neurodermatitis: Secondary | ICD-10-CM | POA: Diagnosis not present

## 2022-06-20 DIAGNOSIS — Z79899 Other long term (current) drug therapy: Secondary | ICD-10-CM

## 2022-06-20 MED ORDER — DUPIXENT 300 MG/2ML ~~LOC~~ SOAJ: 300.0000 mg | SUBCUTANEOUS | 0 refills | Status: DC

## 2022-06-20 NOTE — Patient Instructions (Signed)
Due to recent changes in healthcare laws, you may see results of your pathology and/or laboratory studies on MyChart before the doctors have had a chance to review them. We understand that in some cases there may be results that are confusing or concerning to you. Please understand that not all results are received at the same time and often the doctors may need to interpret multiple results in order to provide you with the best plan of care or course of treatment. Therefore, we ask that you please give us 2 business days to thoroughly review all your results before contacting the office for clarification. Should we see a critical lab result, you will be contacted sooner.   If You Need Anything After Your Visit  If you have any questions or concerns for your doctor, please call our main line at [phone] and press option 4 to reach your doctor's medical assistant. If no one answers, please leave a voicemail as directed and we will return your call as soon as possible. Messages left after 4 pm will be answered the following business day.   You may also send us a message via MyChart. We typically respond to MyChart messages within 1-2 business days.  For prescription refills, please ask your pharmacy to contact our office. Our fax number is [phone].  If you have an urgent issue when the clinic is closed that cannot wait until the next business day, you can page your doctor at the number below.    Please note that while we do our best to be available for urgent issues outside of office hours, we are not available 24/7.   If you have an urgent issue and are unable to reach us, you may choose to seek medical care at your doctor's office, retail clinic, urgent care center, or emergency room.  If you have a medical emergency, please immediately call 911 or go to the emergency department.  Pager Numbers  - Dr. Kowalski: [phone]  - Dr. Moye: [phone]  - Dr. Stewart:  [phone]  In the event of inclement weather, please call our main line at [phone] for an update on the status of any delays or closures.  Dermatology Medication Tips: Please keep the boxes that topical medications come in in order to help keep track of the instructions about where and how to use these. Pharmacies typically print the medication instructions only on the boxes and not directly on the medication tubes.   If your medication is too expensive, please contact our office at [phone] option 4 or send us a message through MyChart.   We are unable to tell what your co-pay for medications will be in advance as this is different depending on your insurance coverage. However, we may be able to find a substitute medication at lower cost or fill out paperwork to get insurance to cover a needed medication.   If a prior authorization is required to get your medication covered by your insurance company, please allow us 1-2 business days to complete this process.  Drug prices often vary depending on where the prescription is filled and some pharmacies may offer cheaper prices.  The website www.goodrx.com contains coupons for medications through different pharmacies. The prices here do not account for what the cost may be with help from insurance (it may be cheaper with your insurance), but the website can give you the price if you did not use any insurance.  - You can print the associated coupon and take it with   your prescription to the pharmacy.  - You may also stop by our office during regular business hours and pick up a GoodRx coupon card.  - If you need your prescription sent electronically to a different pharmacy, notify our office through [facility] MyChart or by phone at [phone] option 4.     Si Usted Necesita Algo Despus de Su Visita  Tambin puede enviarnos un mensaje a travs de MyChart. Por lo general respondemos a los mensajes de MyChart en el transcurso de 1 a 2  das hbiles.  Para renovar recetas, por favor pida a su farmacia que se ponga en contacto con nuestra oficina. Nuestro nmero de fax es el [phone].  Si tiene un asunto urgente cuando la clnica est cerrada y que no puede esperar hasta el siguiente da hbil, puede llamar/localizar a su doctor(a) al nmero que aparece a continuacin.   Por favor, tenga en cuenta que aunque hacemos todo lo posible para estar disponibles para asuntos urgentes fuera del horario de oficina, no estamos disponibles las 24 horas del da, los 7 das de la semana.   Si tiene un problema urgente y no puede comunicarse con nosotros, puede optar por buscar atencin mdica  en el consultorio de su doctor(a), en una clnica privada, en un centro de atencin urgente o en una sala de emergencias.  Si tiene una emergencia mdica, por favor llame inmediatamente al 911 o vaya a la sala de emergencias.  Nmeros de bper  - Dr. Kowalski: [phone]  - Dra. Moye: [phone]  - Dra. Stewart: [phone]  En caso de inclemencias del tiempo, por favor llame a nuestra lnea principal al [phone] para una actualizacin sobre el estado de cualquier retraso o cierre.  Consejos para la medicacin en dermatologa: Por favor, guarde las cajas en las que vienen los medicamentos de uso tpico para ayudarle a seguir las instrucciones sobre dnde y cmo usarlos. Las farmacias generalmente imprimen las instrucciones del medicamento slo en las cajas y no directamente en los tubos del medicamento.   Si su medicamento es muy caro, por favor, pngase en contacto con nuestra oficina llamando al [phone] y presione la opcin 4 o envenos un mensaje a travs de MyChart.   No podemos decirle cul ser su copago por los medicamentos por adelantado ya que esto es diferente dependiendo de la cobertura de su seguro. Sin embargo, es posible que podamos encontrar un medicamento sustituto a menor costo o llenar un formulario para que el  seguro cubra el medicamento que se considera necesario.   Si se requiere una autorizacin previa para que su compaa de seguros cubra su medicamento, por favor permtanos de 1 a 2 das hbiles para completar este proceso.  Los precios de los medicamentos varan con frecuencia dependiendo del lugar de dnde se surte la receta y alguna farmacias pueden ofrecer precios ms baratos.  El sitio web www.goodrx.com tiene cupones para medicamentos de diferentes farmacias. Los precios aqu no tienen en cuenta lo que podra costar con la ayuda del seguro (puede ser ms barato con su seguro), pero el sitio web puede darle el precio si no utiliz ningn seguro.  - Puede imprimir el cupn correspondiente y llevarlo con su receta a la farmacia.  - Tambin puede pasar por nuestra oficina durante el horario de atencin regular y recoger una tarjeta de cupones de GoodRx.  - Si necesita que su receta se enve electrnicamente a una farmacia diferente, informe a nuestra oficina a travs de MyChart de    o por telfono llamando al [phone] y presione la opcin 4.  

## 2022-06-20 NOTE — Progress Notes (Signed)
   Follow-Up Visit   Subjective  Breanna Nguyen is a 26 y.o. female who presents for the following: Eczema (Trunk, extremities, 2wk f/u, Dupixent sq injections with no s/w, no itching, clobetasol prn flares, Tacrolimus oint prn flares).  The following portions of the chart were reviewed this encounter and updated as appropriate:   Tobacco  Allergies  Meds  Problems  Med Hx  Surg Hx  Fam Hx     Review of Systems:  No other skin or systemic complaints except as noted in HPI or Assessment and Plan.  Objective  Well appearing patient in no apparent distress; mood and affect are within normal limits.  A focused examination was performed including face, arms. Relevant physical exam findings are noted in the Assessment and Plan.  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  Chronic and persistent condition with duration or expected duration over one year. Condition is bothersome/symptomatic for patient.    Started Dupixent 03/19/22 No persistent itching on Dupixent,  No side effects on Dupixent and specifically no injection site reactions and no conjunctivitis.   Atopic dermatitis - Severe, on Dupixent (biologic medication).  Atopic dermatitis (eczema) is a chronic, relapsing, pruritic condition that can significantly affect quality of life. It is often associated with allergic rhinitis and/or asthma and can require treatment with topical medications, phototherapy, or in severe cases a biologic medication called Dupixent, which requires long term medication management.     Cont Dupixent 300mg /32ml sq injections q 2 wks Injection today to R upper arm Lot 3m, exp 05/07/2024 Cont Protopic 0.1% oint qd/bid aa eczema until  clear, then prn flares Cont Clobetasol cr to qd/bid up to 5d/wk aa eczema prn flares, avoid f/g/a   Dupilumab (Dupixent) is a treatment given by injection for adults and children with moderate-to-severe atopic dermatitis. Goal is control of skin condition, not cure. It is given as 2 injections at the first dose followed by 1 injection ever 2 weeks thereafter.  Young children are dosed monthly.   Potential side effects include allergic reaction, herpes infections, injection site reactions and conjunctivitis (inflammation of the eyes).  The use of Dupixent requires long term medication management, including periodic office visits.    Topical steroids (such as triamcinolone, fluocinolone, fluocinonide, mometasone, clobetasol, halobetasol, betamethasone, hydrocortisone) can cause thinning and lightening of the skin if they are used for too long in the same area. Your physician has selected the right strength medicine for your problem and area affected on the body. Please use your medication only as directed by your physician to prevent side effects.    Dupilumab (DUPIXENT) 300 MG/2ML SOPN - trunk, extremities Inject 300 mg into the skin every 14 (fourteen) days. Starting at day 15 for maintenance.  Return in about 2 weeks (around 07/04/2022) for Atopic Derm with nurse, f/u with Dr 07/06/2022 6 wks.  I, Breanna Nguyen, RMA, am acting as scribe for Ardis Rowan, MD . Documentation: I have reviewed the above documentation for accuracy and completeness, and I agree with the above.  Armida Sans, MD

## 2022-06-22 ENCOUNTER — Encounter: Payer: Self-pay | Admitting: Dermatology

## 2022-07-03 NOTE — Progress Notes (Deleted)
Complete physical exam   Patient: Breanna Nguyen   DOB: 14-Jan-1996   26 y.o. Female  MRN: [id number] Visit Date: 07/04/2022    No chief complaint on file.  Subjective    Breanna Nguyen is a 26 y.o. female who presents today for a complete physical exam.  She reports consuming a {diet types:17450} diet. {Exercise:19826} She generally feels {well/fairly well/poorly:18703}. She {does/does not:200015} have additional problems to discuss today.   HPI  Annual physical  -recently changed sertraline to lexapro 10 mg daily  -added hydroxyzine 25 mg twice daily as needed for acute anxiety   Past Medical History:  Diagnosis Date   GERD (gastroesophageal reflux disease)    Gestational diabetes    metformin   Past Surgical History:  Procedure Laterality Date   NO PAST SURGERIES     Social History   Socioeconomic History   Marital status: Single    Spouse name: Not on file   Number of children: Not on file   Years of education: Not on file   Highest education level: Not on file  Occupational History   Not on file  Tobacco Use   Smoking status: Every Day    Packs/day: 1.00    Types: Cigarettes   Smokeless tobacco: Never   Tobacco comments:    stopped with preg  Vaping Use   Vaping Use: Former  Substance and Sexual Activity   Alcohol use: Yes   Drug use: Not Currently    Types: Marijuana    Comment: Last Use December 2021   Sexual activity: Yes  Other Topics Concern   Not on file  Social History Narrative   Not on file   Social Determinants of Health   Financial Resource Strain: Not on file  Food Insecurity: Not on file  Transportation Needs: Not on file  Physical Activity: Not on file  Stress: Not on file  Social Connections: Not on file  Intimate Partner Violence: Not on file   Family Status  Relation Name Status   Mother  Alive   Father  Alive   MGM  (Not Specified)   Family History  Problem Relation Age of Onset   Healthy Mother    High Cholesterol  Mother    Healthy Father    High Cholesterol Maternal Grandmother    No Known Allergies  Patient Care Team: Ronnell Freshwater, NP as PCP - General (Family Medicine)   Medications: Outpatient Medications Prior to Visit  Medication Sig   clobetasol cream (TEMOVATE) 1.74 % Apply 1 Application topically 2 (two) times daily.   Dupilumab (DUPIXENT) 300 MG/2ML SOPN Inject 300 mg into the skin every 14 (fourteen) days. Starting at day 15 for maintenance.   escitalopram (LEXAPRO) 10 MG tablet Take 1 tablet (10 mg total) by mouth daily.   hydrOXYzine (ATARAX) 25 MG tablet Take 1/2 to 1 tablet po Bid prn itching   mometasone (ELOCON) 0.1 % cream Apply 1 Application topically 2 (two) times daily. Apply twice a day to affected areas as needed up to 5 days a week.   No facility-administered medications prior to visit.    Review of Systems  {Labs (Optional):23779}   Objective    There were no vitals filed for this visit. There is no height or weight on file to calculate BMI.  BP Readings from Last 3 Encounters:  06/20/22 108/64  06/06/22 (Abnormal) 111/99  05/29/22 124/83    Wt Readings from Last 3 Encounters:  05/29/22 135 lb (61.2 kg)  05/01/22 138 lb 6.4 oz (62.8 kg)  03/18/22 142 lb 6.4 oz (64.6 kg)     Physical Exam  ***  Last depression screening scores   Row Labels 05/29/2022   11:08 AM 05/01/2022    1:13 PM 03/18/2022    2:39 PM  PHQ 2/9 Scores   Section Header. No data exists in this row.     PHQ - 2 Score   0 2 0  PHQ- 9 Score   0 8 0   Last fall risk screening   Row Labels 05/29/2022   11:09 AM  Fall Risk    Section Header. No data exists in this row.   Falls in the past year?   0  Number falls in past yr:   0  Injury with Fall?   0   Last Audit-C alcohol use screening   No data to display    A score of 3 or more in women, and 4 or more in men indicates increased risk for alcohol abuse, EXCEPT if all of the points are from question 1   No results found  for any visits on 07/04/22.  Assessment & Plan    Routine Health Maintenance and Physical Exam  Exercise Activities and Dietary recommendations  Goals   None     Immunization History  Administered Date(s) Administered   DTaP 03/12/1996, 09/30/1996, 10/16/2000   DTaP / IPV 10/07/1995, 12/22/1995   HIB (PRP-OMP) 10/07/1995, 12/22/1995, 03/12/1996, 08/16/1996   HPV Quadrivalent 06/28/2010, 08/31/2010, 12/28/2010   Hepatitis A, Ped/Adol-2 Dose 02/29/2008, 02/21/2010   Hepatitis B, PED/ADOLESCENT September 25, 1995, 10/17/1995, 03/12/1996   IPV 12/22/1995, 03/12/1996, 10/16/2000   MMR 08/16/1996, 10/16/2000   Meningococcal Conjugate 02/21/2010, 02/15/2014   Tdap 02/29/2008, 07/03/2021   Varicella 09/30/1996, 02/29/2008    Health Maintenance  Topic Date Due   COVID-19 Vaccine (1) Never done   INFLUENZA VACCINE  10/06/2022 (Originally 02/05/2022)   PAP-Cervical Cytology Screening  07/03/2024   PAP SMEAR-Modifier  07/03/2024   DTaP/Tdap/Td (8 - Td or Tdap) 07/04/2031   HPV VACCINES  Completed   Hepatitis C Screening  Completed   HIV Screening  Completed    Discussed health benefits of physical activity, and encouraged her to engage in regular exercise appropriate for her age and condition.  Problem List Items Addressed This Visit   None    No follow-ups on file.        Ronnell Freshwater, NP  [facility] at [facility] [phone] (phone) [phone] (fax)  [place]

## 2022-07-04 ENCOUNTER — Ambulatory Visit: Payer: Medicaid Other

## 2022-07-04 ENCOUNTER — Encounter: Payer: Medicaid Other | Admitting: Nurse Practitioner

## 2022-07-05 ENCOUNTER — Encounter: Payer: Self-pay | Admitting: Dermatology

## 2022-07-09 DIAGNOSIS — R051 Acute cough: Secondary | ICD-10-CM | POA: Diagnosis not present

## 2022-07-09 DIAGNOSIS — M791 Myalgia, unspecified site: Secondary | ICD-10-CM | POA: Diagnosis not present

## 2022-07-09 DIAGNOSIS — R509 Fever, unspecified: Secondary | ICD-10-CM | POA: Diagnosis not present

## 2022-07-09 NOTE — Progress Notes (Deleted)
Established patient visit   Patient: Breanna Nguyen   DOB: 22-Jul-1995   27 y.o. Female  MRN: [id number] Visit Date: 07/10/2022   No chief complaint on file.  Subjective    HPI  Follow up -generalized anxiety --changed sertraline to lexapro  --added hydroxyzine to take as needed for acute anxiety  -now seeing dermatology for severe atopic dermatitis    Medications: Outpatient Medications Prior to Visit  Medication Sig   clobetasol cream (TEMOVATE) 5.91 % Apply 1 Application topically 2 (two) times daily.   Dupilumab (DUPIXENT) 300 MG/2ML SOPN Inject 300 mg into the skin every 14 (fourteen) days. Starting at day 15 for maintenance.   escitalopram (LEXAPRO) 10 MG tablet Take 1 tablet (10 mg total) by mouth daily.   hydrOXYzine (ATARAX) 25 MG tablet Take 1/2 to 1 tablet po Bid prn itching   mometasone (ELOCON) 0.1 % cream Apply 1 Application topically 2 (two) times daily. Apply twice a day to affected areas as needed up to 5 days a week.   No facility-administered medications prior to visit.    Review of Systems  {Labs (Optional):23779}   Objective    There were no vitals filed for this visit. There is no height or weight on file to calculate BMI.  BP Readings from Last 3 Encounters:  06/20/22 108/64  06/06/22 (Abnormal) 111/99  05/29/22 124/83    Wt Readings from Last 3 Encounters:  05/29/22 135 lb (61.2 kg)  05/01/22 138 lb 6.4 oz (62.8 kg)  03/18/22 142 lb 6.4 oz (64.6 kg)    Physical Exam  ***  No results found for any visits on 07/10/22.  Assessment & Plan     Problem List Items Addressed This Visit   None    No follow-ups on file.         Ronnell Freshwater, NP  [facility] at [facility] [phone] (phone) [phone] (fax)  [place]

## 2022-07-10 ENCOUNTER — Ambulatory Visit: Payer: Medicaid Other | Admitting: Nurse Practitioner

## 2022-09-10 ENCOUNTER — Ambulatory Visit (INDEPENDENT_AMBULATORY_CARE_PROVIDER_SITE_OTHER): Payer: Medicaid Other | Admitting: Dermatology

## 2022-09-10 VITALS — BP 104/63 | HR 51

## 2022-09-10 DIAGNOSIS — Z79899 Other long term (current) drug therapy: Secondary | ICD-10-CM

## 2022-09-10 DIAGNOSIS — L2081 Atopic neurodermatitis: Secondary | ICD-10-CM

## 2022-09-10 MED ORDER — TACROLIMUS 0.1 % EX OINT: TOPICAL_OINTMENT | CUTANEOUS | 3 refills | Status: DC

## 2022-09-10 MED ORDER — CLOBETASOL PROPIONATE 0.05 % EX CREA: 1.0000 | TOPICAL_CREAM | CUTANEOUS | 3 refills | Status: DC

## 2022-09-10 NOTE — Patient Instructions (Addendum)
Cont Dupixent '300mg'$ /41m injections every 2 weeks Decrease Clobetasol to once a day up to 5 days a week to affected areas of eczema on back, avoid using on face, groin, or underarms Start Protopic 0.1% ointment nightly to eyelids, may use on other areas of eczema if needed    Due to recent changes in healthcare laws, you may see results of your pathology and/or laboratory studies on MyChart before the doctors have had a chance to review them. We understand that in some cases there may be results that are confusing or concerning to you. Please understand that not all results are received at the same time and often the doctors may need to interpret multiple results in order to provide you with the best plan of care or course of treatment. Therefore, we ask that you please give u[place]2 business days to thoroughly review all your results before contacting the office for clarification. Should we see a critical lab result, you will be contacted sooner.   If You Need Anything After Your Visit  If you have any questions or concerns for your doctor, please call our main line at [phone] press option 4 to reach your doctor's medical assistant. If no one answers, please leave a voicemail as directed and we will return your call as soon as possible. Messages left after 4 pm will be answered the following business day.   You may also send [place] message via [place] We typically respond to MyChart messages within 1-2 business days.  For prescription refills, please ask your pharmacy to contact our office. Our fax number is [phone]  If you have an urgent issue when the clinic is closed that cannot wait until the next business day, you can page your doctor at the number below.    Please note that while we do our best to be available for urgent issues outside of office hours, we are not available 24/7.   If you have an urgent issue and are unable to reach u[place] you may choose to seek medical care at your  doctor's office, retail clinic, urgent care center, or emergency room.  If you have a medical emergency, please immediately call 911 or go to the emergency department.  Pager Numbers  - Dr. KNehemiah Massed [phone] - Dr. MLaurence Ferrari [phone] - Dr. SNicole Kindred [phone] In the event of inclement weather, please call our main line at [phone] an update on the status of any delays or closures.  Dermatology Medication Tips: Please keep the boxes that topical medications come in in order to help keep track of the instructions about where and how to use these. Pharmacies typically print the medication instructions only on the boxes and not directly on the medication tubes.   If your medication is too expensive, please contact our office at [phone] 4 or send [place] message through [place]   We are unable to tell what your co-pay for medications will be in advance as this is different depending on your insurance coverage. However, we may be able to find a substitute medication at lower cost or fill out paperwork to get insurance to cover a needed medication.   If a prior authorization is required to get your medication covered by your insurance company, please allow u[place]1-2 business days to complete this process.  Drug prices often vary depending on where the prescription is filled and some pharmacies may offer cheaper prices.  The website www.goodrx.com contains coupons for medications through  different pharmacies. The prices here do not account for what the cost may be with help from insurance (it may be cheaper with your insurance), but the website can give you the price if you did not use any insurance.  - You can print the associated coupon and take it with your prescription to the pharmacy.  - You may also stop by our office during regular business hours and pick up a GoodRx coupon card.  - If you need your prescription sent electronically to a different pharmacy, notify  our office through [facility] or by phone at [phone] option 4.     Si Usted Necesita Algo Despus de Su Visita  Tambin puede enviarnos un mensaje a travs de Pharmacist, community. Por lo general respondemos a los mensajes de MyChart en el transcurso de 1 a 2 das hbiles.  Para renovar recetas, por favor pida a su farmacia que se ponga en contacto con nuestra oficina. Harland Dingwall de fax es [place] [phone].  Si tiene un asunto urgente cuando la clnica est cerrada y que no puede esperar hasta el siguiente da hbil, puede llamar/localizar a su doctor(a) al nmero que aparece a continuacin.   Por favor, tenga en cuenta que aunque hacemos todo lo posible para estar disponibles para asuntos urgentes fuera del horario de [place], no estamos disponibles las 24 horas del da, los 7 das de la [place].   Si tiene un problema urgente y no puede comunicarse con nosotros, puede optar por buscar atencin mdica  en el consultorio de su doctor(a), en una clnica privada, en un centro de atencin urgente o en una sala de emergencias.  Si tiene Engineering geologist, por favor llame inmediatamente al 911 o vaya a la sala de emergencias.  Nmeros de bper  - Dr. Nehemiah Massed: [phone]  - Dra. Moye: [phone]  - Dra. Nicole Kindred: [phone]  En caso de inclemencias del [place], por favor llame a Johnsie Kindred principal al [phone] para una actualizacin sobre el [place] de cualquier retraso o cierre.  Consejos para la medicacin en dermatologa: Por favor, guarde las cajas en las que vienen los medicamentos de uso tpico para ayudarle a seguir las instrucciones sobre dnde y cmo usarlos. Las farmacias generalmente imprimen las instrucciones del medicamento slo en las cajas y no directamente en los tubos del [place].   Si su medicamento es muy caro, por favor, pngase en contacto con Zigmund Daniel llamando al [phone] y presione la opcin 4 o envenos un mensaje a travs de  Pharmacist, community.   No podemos decirle cul ser su copago por los medicamentos por adelantado ya que esto es diferente dependiendo de la cobertura de su seguro. Sin embargo, es posible que podamos encontrar un medicamento sustituto a Electrical engineer un formulario para que el seguro cubra el medicamento que se considera necesario.   Si se requiere una autorizacin previa para que su compaa de seguros [place] su medicamento, por favor permtanos de 1 a 2 das hbiles para completar este proceso.  Los precios de los medicamentos varan con frecuencia dependiendo del Environmental consultant de dnde se surte la receta y alguna farmacias pueden ofrecer precios ms baratos.  El sitio web www.goodrx.com tiene cupones para medicamentos de Airline pilot. Los precios aqu no tienen en cuenta lo que podra costar con la ayuda del seguro (puede ser ms barato con su seguro), pero el sitio web puede darle el precio si no utiliz Research scientist (physical sciences).  - Puede imprimir el cupn correspondiente y llevarlo con  su receta a la farmacia.  - Tambin puede pasar por nuestra oficina durante el horario de atencin regular y Charity fundraiser una tarjeta de cupones de GoodRx.  - Si necesita que su receta se enve electrnicamente a una farmacia diferente, informe a nuestra oficina a travs de MyChart de [place] o por telfono llamando al [phone] y presione la opcin 4.

## 2022-09-10 NOTE — Progress Notes (Signed)
Follow-Up Visit   Subjective  Breanna Nguyen is a 27 y.o. female who presents for the following: Eczema (Trunk, extremities, 22m f/u, Dupixent sq injections q 2 wks, no se from Dupixent, Protopic 0.1% oint bid, Clobetasol cr qd to back).  Patient accompanied by mother.  The following portions of the chart were reviewed this encounter and updated as appropriate:   Tobacco  Allergies  Meds  Problems  Med Hx  Surg Hx  Fam Hx     Review of Systems:  No other skin or systemic complaints except as noted in HPI or Assessment and Plan.  Objective  Well appearing patient in no apparent distress; mood and affect are within normal limits.  A focused examination was performed including face, back. Relevant physical exam findings are noted in the Assessment and Plan.  trunk, extremities Pink patches post waistline, eyelids with pinkness   Assessment & Plan  Atopic neurodermatitis Chronic and persistent condition with duration or expected duration over one year. Condition is symptomatic / bothersome to patient. Not to goal, but overall significantly improved. trunk, extremities Severe Hx Worsened by water and fragrances -  Atopic dermatitis impacts aspects of daily living, difficult to give daughter baths, wash dishes, and bathe herself.  Chronic and persistent condition with duration or expected duration over one year. Condition is bothersome/symptomatic for patient.    Started Dupixent 03/19/22 No persistent itching on Dupixent,  No side effects on Dupixent and specifically no injection site reactions and no conjunctivitis.  Atopic dermatitis - Severe, on Dupixent (biologic medication).  Atopic dermatitis (eczema) is a chronic, relapsing, pruritic condition that can significantly affect quality of life. It is often associated with allergic rhinitis and/or asthma and can require treatment with topical medications, phototherapy, or in severe cases biologic medications, which require  long term medication management.    Cont Dupixent 300mg /80ml sq injections q 2 wks Cont Protopic 0.1% oint qd/bid aa eyelids, body Cont Clobetasol cr qd up to 5d/wk aa eczema, avoid f/g/a  If Protopic stings eyelids can send in Elidel Discussed Rinvoq, [place] (Dupixent) is a treatment given by injection for adults and children with moderate-to-severe atopic dermatitis. Goal is control of skin condition, not cure. It is given as 2 injections at the first dose followed by 1 injection ever 2 weeks thereafter.  Young children are dosed monthly.  Potential side effects include allergic reaction, herpes infections, injection site reactions and conjunctivitis (inflammation of the eyes).  The use of Dupixent requires long term medication management, including periodic office visits.   Topical steroids (such as triamcinolone, fluocinolone, fluocinonide, mometasone, clobetasol, halobetasol, betamethasone, hydrocortisone) can cause thinning and lightening of the skin if they are used for too long in the same area. Your physician has selected the right strength medicine for your problem and area affected on the body. Please use your medication only as directed by your physician to prevent side effects.    clobetasol cream (TEMOVATE) 0.05 % - trunk, extremities Apply 1 Application topically as directed. Qd up to 5 days a week to affected areas of eczema on body, avoid face, groin, axilla  tacrolimus (PROTOPIC) 0.1 % ointment - trunk, extremities Apply topically as directed. Qd to bid aa eczema on eyelids and body as needed for flares  Related Medications Dupilumab (DUPIXENT) 300 MG/2ML SOPN Inject 300 mg into the skin every 14 (fourteen) days. Starting at day 15 for maintenance.   Return in about 4 months (around 01/10/2023) for Atopic Derm.  I, Davy Pique  Hupman, RMA, am acting as scribe for Sarina Ser, MD . Documentation: I have reviewed the above documentation for accuracy and  completeness, and I agree with the above.  Sarina Ser, MD

## 2022-09-14 ENCOUNTER — Encounter: Payer: Self-pay | Admitting: Dermatology

## 2022-10-21 ENCOUNTER — Other Ambulatory Visit: Payer: Self-pay

## 2022-10-21 DIAGNOSIS — L2081 Atopic neurodermatitis: Secondary | ICD-10-CM

## 2022-10-21 MED ORDER — DUPIXENT 300 MG/2ML ~~LOC~~ SOAJ: 300.0000 mg | SUBCUTANEOUS | 2 refills | Status: DC

## 2022-10-23 ENCOUNTER — Other Ambulatory Visit: Payer: Self-pay

## 2022-10-23 DIAGNOSIS — L2081 Atopic neurodermatitis: Secondary | ICD-10-CM

## 2022-10-23 MED ORDER — DUPIXENT 300 MG/2ML ~~LOC~~ SOAJ: 300.0000 mg | SUBCUTANEOUS | 2 refills | Status: DC

## 2022-10-23 NOTE — Progress Notes (Signed)
Rx clarification sent to senderra

## 2022-11-04 ENCOUNTER — Ambulatory Visit (INDEPENDENT_AMBULATORY_CARE_PROVIDER_SITE_OTHER): Payer: Medicaid Other | Admitting: Nurse Practitioner

## 2022-11-04 ENCOUNTER — Encounter: Payer: Self-pay | Admitting: Nurse Practitioner

## 2022-11-04 VITALS — BP 105/69 | HR 50 | Ht 62.0 in | Wt 136.6 lb

## 2022-11-04 DIAGNOSIS — R7301 Impaired fasting glucose: Secondary | ICD-10-CM | POA: Diagnosis not present

## 2022-11-04 DIAGNOSIS — E559 Vitamin D deficiency, unspecified: Secondary | ICD-10-CM

## 2022-11-04 DIAGNOSIS — Z0001 Encounter for general adult medical examination with abnormal findings: Secondary | ICD-10-CM | POA: Diagnosis not present

## 2022-11-04 DIAGNOSIS — F411 Generalized anxiety disorder: Secondary | ICD-10-CM

## 2022-11-04 DIAGNOSIS — L2084 Intrinsic (allergic) eczema: Secondary | ICD-10-CM

## 2022-11-04 DIAGNOSIS — R5383 Other fatigue: Secondary | ICD-10-CM | POA: Diagnosis not present

## 2022-11-04 DIAGNOSIS — Z13220 Encounter for screening for lipoid disorders: Secondary | ICD-10-CM | POA: Diagnosis not present

## 2022-11-04 NOTE — Progress Notes (Signed)
Complete physical exam   Patient: Breanna Nguyen   DOB: 20-Apr-1996   27 y.o. Female  MRN: [id number] Visit Date: 11/04/2022    Chief Complaint  Patient presents with   Annual Exam   Subjective    Breanna Nguyen is a 27 y.o. female who presents today for a complete physical exam.  She reports consuming a  generally healthy  diet. Home exercise routine includes being very busy with a 86-year old and three dogs. She generally feels fairly well. She does not have additional problems to discuss today.   HPI  Annual physical  -history of anxiety due to family and situational stressors.  -stable with lexapro daily and atarax as needed.  --states that the current dose lexapro doing ok and she uses hydroxyzine intermittently.  -due to have routine, fasting labs.  -sees dermatology for severe eczema.  -denies chest pain, chest pressure, or shortness of breath. He denies headaches or visual disturbances. He denies abdominal pain, nausea, vomiting, or changes in bowel or bladder habits.     Past Medical History:  Diagnosis Date   GERD (gastroesophageal reflux disease)    Gestational diabetes    metformin   Past Surgical History:  Procedure Laterality Date   NO PAST SURGERIES     Social History   Socioeconomic History   Marital status: Single    Spouse name: Not on file   Number of children: Not on file   Years of education: Not on file   Highest education level: Not on file  Occupational History   Not on file  Tobacco Use   Smoking status: Every Day    Packs/day: 1    Types: Cigarettes   Smokeless tobacco: Never   Tobacco comments:    stopped with preg  Vaping Use   Vaping Use: Former  Substance and Sexual Activity   Alcohol use: Yes   Drug use: Not Currently    Types: Marijuana    Comment: Last Use December 2021   Sexual activity: Yes  Other Topics Concern   Not on file  Social History Narrative   Not on file   Social Determinants of Health   Financial Resource  Strain: Not on file  Food Insecurity: Not on file  Transportation Needs: Not on file  Physical Activity: Not on file  Stress: Not on file  Social Connections: Not on file  Intimate Partner Violence: Not on file   Family Status  Relation Name Status   Mother  Alive   Father  Alive   MGM  (Not Specified)   Family History  Problem Relation Age of Onset   Healthy Mother    High Cholesterol Mother    Healthy Father    High Cholesterol Maternal Grandmother    No Known Allergies  Patient Care Team: Carlean Jews, NP as PCP - General (Family Medicine)   Medications: Outpatient Medications Prior to Visit  Medication Sig   clobetasol cream (TEMOVATE) 0.05 % Apply 1 Application topically as directed. Qd up to 5 days a week to affected areas of eczema on body, avoid face, groin, axilla   Dupilumab (DUPIXENT) 300 MG/2ML SOPN Inject 300 mg into the skin every 14 (fourteen) days. Starting at day 15 for maintenance.   escitalopram (LEXAPRO) 10 MG tablet Take 1 tablet (10 mg total) by mouth daily.   hydrOXYzine (ATARAX) 25 MG tablet Take 1/2 to 1 tablet po Bid prn itching   mometasone (ELOCON) 0.1 % cream Apply 1 Application topically 2 (  two) times daily. Apply twice a day to affected areas as needed up to 5 days a week.   tacrolimus (PROTOPIC) 0.1 % ointment Apply topically as directed. Qd to bid aa eczema on eyelids and body as needed for flares   No facility-administered medications prior to visit.    Review of Systems See HPI      Objective     Today's Vitals   11/04/22 0919  BP: 105/69  Pulse: (Abnormal) 50  SpO2: 100%  Weight: 136 lb 9.6 oz (62 kg)  Height: 5\' 2"  (1.575 m)   Body mass index is 24.98 kg/m.  BP Readings from Last 3 Encounters:  11/04/22 105/69  09/10/22 104/63  06/20/22 108/64    Wt Readings from Last 3 Encounters:  11/04/22 136 lb 9.6 oz (62 kg)  05/29/22 135 lb (61.2 kg)  05/01/22 138 lb 6.4 oz (62.8 kg)     Physical Exam Vitals and  nursing note reviewed.  Constitutional:      Appearance: Normal appearance. She is well-developed.  HENT:     Head: Normocephalic and atraumatic.     Right Ear: Tympanic membrane, ear canal and external ear normal.     Left Ear: Tympanic membrane, ear canal and external ear normal.     Nose: Nose normal.     Mouth/Throat:     Mouth: Mucous membranes are moist.     Pharynx: Oropharynx is clear.  Eyes:     Extraocular Movements: Extraocular movements intact.     Conjunctiva/sclera: Conjunctivae normal.     Pupils: Pupils are equal, round, and reactive to light.  Neck:     Vascular: No carotid bruit.  Cardiovascular:     Rate and Rhythm: Normal rate and regular rhythm.     Pulses: Normal pulses.     Heart sounds: Normal heart sounds.  Pulmonary:     Effort: Pulmonary effort is normal.     Breath sounds: Normal breath sounds.  Abdominal:     General: Bowel sounds are normal. There is no distension.     Palpations: Abdomen is soft. There is no mass.     Tenderness: There is no abdominal tenderness. There is no right CVA tenderness, left CVA tenderness, guarding or rebound.     Hernia: No hernia is present.  Musculoskeletal:        General: Normal range of motion.     Cervical back: Normal range of motion and neck supple.  Lymphadenopathy:     Cervical: No cervical adenopathy.  Skin:    General: Skin is warm and dry.     Capillary Refill: Capillary refill takes less than 2 seconds.  Neurological:     General: No focal deficit present.     Mental Status: She is alert and oriented to person, place, and time.  Psychiatric:        Mood and Affect: Mood normal.        Behavior: Behavior normal.        Thought Content: Thought content normal.        Judgment: Judgment normal.      Last depression screening scores   Row Labels 11/04/2022    9:20 AM 05/29/2022   11:08 AM 05/01/2022    1:13 PM  PHQ 2/9 Scores   Section Header. No data exists in this row.     PHQ - 2 Score   0 0  2  PHQ- 9 Score   0 0 8   Last fall risk  screening   Row Labels 11/04/2022    9:20 AM  Fall Risk    Section Header. No data exists in this row.   Falls in the past year?   0  Number falls in past yr:   0  Injury with Fall?   0  Follow up   Falls evaluation completed     Results for orders placed or performed in visit on 11/04/22  CBC  Result Value Ref Range   WBC 7.4 3.4 - 10.8 x10E3/uL   RBC 4.89 3.77 - 5.28 x10E6/uL   Hemoglobin 14.3 11.1 - 15.9 g/dL   Hematocrit 57.8 46.9 - 46.6 %   MCV 91 79 - 97 fL   MCH 29.2 26.6 - 33.0 pg   MCHC 32.2 31.5 - 35.7 g/dL   RDW 62.9 52.8 - 41.3 %   Platelets 280 150 - 450 x10E3/uL  Comprehensive metabolic panel  Result Value Ref Range   Glucose 89 70 - 99 mg/dL   BUN 11 6 - 20 mg/dL   Creatinine, Ser 2.44 0.57 - 1.00 mg/dL   eGFR 99 >01 UU/VOZ/3.66   BUN/Creatinine Ratio 13 9 - 23   Sodium 139 134 - 144 mmol/L   Potassium 4.3 3.5 - 5.2 mmol/L   Chloride 103 96 - 106 mmol/L   CO2 22 20 - 29 mmol/L   Calcium 9.3 8.7 - 10.2 mg/dL   Total Protein 7.0 6.0 - 8.5 g/dL   Albumin 4.5 4.0 - 5.0 g/dL   Globulin, Total 2.5 1.5 - 4.5 g/dL   Albumin/Globulin Ratio 1.8 1.2 - 2.2   Bilirubin Total 0.5 0.0 - 1.2 mg/dL   Alkaline Phosphatase 65 44 - 121 IU/L   AST 15 0 - 40 IU/L   ALT 10 0 - 32 IU/L  Lipid panel  Result Value Ref Range   Cholesterol, Total 160 100 - 199 mg/dL   Triglycerides 440 0 - 149 mg/dL   HDL 40 >34 mg/dL   VLDL Cholesterol Cal 21 5 - 40 mg/dL   LDL Chol Calc (NIH) 99 0 - 99 mg/dL   Chol/HDL Ratio 4.0 0.0 - 4.4 ratio  VITAMIN D 25 Hydroxy (Vit-D Deficiency, Fractures)  Result Value Ref Range   Vit D, 25-Hydroxy 26.3 (L) 30.0 - 100.0 ng/mL  TSH + free T4  Result Value Ref Range   TSH 2.050 0.450 - 4.500 uIU/mL   Free T4 1.07 0.82 - 1.77 ng/dL  Hemoglobin V4Q  Result Value Ref Range   Hgb A1c MFr Bld 5.0 4.8 - 5.6 %   Est. average glucose Bld gHb Est-mCnc 97 mg/dL    Assessment & Plan    Encounter for general  adult medical examination with abnormal findings  Other fatigue Assessment & Plan: Check routine labs with thyroid panel for further evaluation.  Orders: -     TSH + free T4; Future -     Lipid panel; Future -     Comprehensive metabolic panel; Future -     CBC; Future  Vitamin D deficiency Assessment & Plan: Check vitamin d level and treat deficiency as indicated.    Orders: -     VITAMIN D 25 Hydroxy (Vit-D Deficiency, Fractures); Future  Impaired fasting glucose Assessment & Plan: Check hemoglobin A1c with today's labs.  Orders: -     Hemoglobin A1c; Future -     Lipid panel; Future  Generalized anxiety disorder Assessment & Plan: Stable. -Continue Lexapro 10 mg daily. -May continue hydroxyzine 25  mg as needed and as prescribed for acute anxiety.   Intrinsic eczema Assessment & Plan: Improved and stable. -Continue regular visits with dermatology.     Immunization History  Administered Date(s) Administered   DTaP 03/12/1996, 09/30/1996, 10/16/2000   DTaP / IPV 10/07/1995, 12/22/1995   HIB (PRP-OMP) 10/07/1995, 12/22/1995, 03/12/1996, 08/16/1996   HPV Quadrivalent 06/28/2010, 08/31/2010, 12/28/2010   Hepatitis A, Ped/Adol-2 Dose 02/29/2008, 02/21/2010   Hepatitis B, PED/ADOLESCENT 1996/06/02, 10/17/1995, 03/12/1996   IPV 12/22/1995, 03/12/1996, 10/16/2000   MMR 08/16/1996, 10/16/2000   Meningococcal Conjugate 02/21/2010, 02/15/2014   Tdap 02/29/2008, 07/03/2021   Varicella 09/30/1996, 02/29/2008    Health Maintenance  Topic Date Due   COVID-19 Vaccine (1) Never done   INFLUENZA VACCINE  02/06/2023   PAP-Cervical Cytology Screening  07/03/2024   PAP SMEAR-Modifier  07/03/2024   DTaP/Tdap/Td (8 - Td or Tdap) 07/04/2031   HPV VACCINES  Completed   Hepatitis C Screening  Completed   HIV Screening  Completed    Discussed health benefits of physical activity, and encouraged her to engage in regular exercise appropriate for her age and  condition.    Return in about 6 months (around 05/06/2023) for mood.        Carlean Jews, NP  [facility] [phone] (phone) [phone] (fax)  [facility]

## 2022-11-05 LAB — COMPREHENSIVE METABOLIC PANEL
ALT: 10 IU/L (ref 0–32)
AST: 15 IU/L (ref 0–40)
Albumin/Globulin Ratio: 1.8 (ref 1.2–2.2)
Albumin: 4.5 g/dL (ref 4.0–5.0)
Alkaline Phosphatase: 65 IU/L (ref 44–121)
BUN/Creatinine Ratio: 13 (ref 9–23)
BUN: 11 mg/dL (ref 6–20)
Bilirubin Total: 0.5 mg/dL (ref 0.0–1.2)
CO2: 22 mmol/L (ref 20–29)
Calcium: 9.3 mg/dL (ref 8.7–10.2)
Chloride: 103 mmol/L (ref 96–106)
Creatinine, Ser: 0.83 mg/dL (ref 0.57–1.00)
Globulin, Total: 2.5 g/dL (ref 1.5–4.5)
Glucose: 89 mg/dL (ref 70–99)
Potassium: 4.3 mmol/L (ref 3.5–5.2)
Sodium: 139 mmol/L (ref 134–144)
Total Protein: 7 g/dL (ref 6.0–8.5)
eGFR: 99 mL/min/{1.73_m2} (ref >59)

## 2022-11-05 LAB — LIPID PANEL
Chol/HDL Ratio: 4 ratio (ref 0.0–4.4)
Cholesterol, Total: 160 mg/dL (ref 100–199)
HDL: 40 mg/dL (ref >39)
LDL Chol Calc (NIH): 99 mg/dL (ref 0–99)
Triglycerides: 115 mg/dL (ref 0–149)
VLDL Cholesterol Cal: 21 mg/dL (ref 5–40)

## 2022-11-05 LAB — CBC
Hematocrit: 44.4 % (ref 34.0–46.6)
Hemoglobin: 14.3 g/dL (ref 11.1–15.9)
MCH: 29.2 pg (ref 26.6–33.0)
MCHC: 32.2 g/dL (ref 31.5–35.7)
MCV: 91 fL (ref 79–97)
Platelets: 280 10*3/uL (ref 150–450)
RBC: 4.89 x10E6/uL (ref 3.77–5.28)
RDW: 13.1 % (ref 11.7–15.4)
WBC: 7.4 10*3/uL (ref 3.4–10.8)

## 2022-11-05 LAB — HEMOGLOBIN A1C
Est. average glucose Bld gHb Est-mCnc: 97 mg/dL
Hgb A1c MFr Bld: 5 % (ref 4.8–5.6)

## 2022-11-05 LAB — VITAMIN D 25 HYDROXY (VIT D DEFICIENCY, FRACTURES): Vit D, 25-Hydroxy: 26.3 ng/mL — ABNORMAL LOW (ref 30.0–100.0)

## 2022-11-05 LAB — TSH+FREE T4
Free T4: 1.07 ng/dL (ref 0.82–1.77)
TSH: 2.05 u[IU]/mL (ref 0.450–4.500)

## 2022-11-24 DIAGNOSIS — R7301 Impaired fasting glucose: Secondary | ICD-10-CM | POA: Insufficient documentation

## 2022-11-24 DIAGNOSIS — E559 Vitamin D deficiency, unspecified: Secondary | ICD-10-CM | POA: Insufficient documentation

## 2022-11-24 NOTE — Assessment & Plan Note (Signed)
Stable. -Continue Lexapro 10 mg daily. -May continue hydroxyzine 25 mg as needed and as prescribed for acute anxiety.

## 2022-11-24 NOTE — Assessment & Plan Note (Signed)
Check hemoglobin A1c with today's labs.

## 2022-11-24 NOTE — Assessment & Plan Note (Signed)
Check routine labs with thyroid panel for further evaluation.

## 2022-11-24 NOTE — Assessment & Plan Note (Signed)
Improved and stable. -Continue regular visits with dermatology.

## 2022-11-24 NOTE — Assessment & Plan Note (Signed)
Check vitamin d level and treat deficiency as indicated.   

## 2022-11-27 ENCOUNTER — Other Ambulatory Visit: Payer: Self-pay

## 2022-11-27 DIAGNOSIS — L2081 Atopic neurodermatitis: Secondary | ICD-10-CM

## 2022-11-27 MED ORDER — DUPIXENT 300 MG/2ML ~~LOC~~ SOAJ
300.0000 mg | SUBCUTANEOUS | 2 refills | Status: DC
Start: 2022-11-27 — End: 2022-12-24

## 2022-11-27 NOTE — Progress Notes (Signed)
Refill request faxed from [facility]. Escripted  

## 2022-12-24 ENCOUNTER — Other Ambulatory Visit: Payer: Self-pay

## 2022-12-24 ENCOUNTER — Telehealth: Payer: Self-pay

## 2022-12-24 DIAGNOSIS — L2081 Atopic neurodermatitis: Secondary | ICD-10-CM

## 2022-12-24 MED ORDER — DUPIXENT 300 MG/2ML ~~LOC~~ SOAJ
300.0000 mg | SUBCUTANEOUS | 2 refills | Status: DC
Start: 2022-12-24 — End: 2023-01-16

## 2022-12-24 NOTE — Telephone Encounter (Signed)
Spoke with patient and prescription handled.  Patient declined getting sample today. aw

## 2022-12-24 NOTE — Telephone Encounter (Signed)
Patient states that she has called several times over the past three weeks and no one has returned her call. Patient states that she has been out of Dupixent for several weeks and Michelene Heady said she has not refills remaining. Evorn Gong RMA sent in a month supply with two refills on 11/27/22 so patient should have enough to last her until her next visit in July. Can you contact Michelene Heady to see what's going on with her prescription? Thank you.

## 2022-12-24 NOTE — Progress Notes (Signed)
Dupixent Rfs sent. 12/05/22 were marked sample, so [facility] did not receive them. RX clarified and sent in. Patient advised. aw

## 2023-01-06 NOTE — Progress Notes (Signed)
Mild vitamin D deficiency  Recommend over-the-counter vitamin D 5000 international units daily.  Other labs normal.

## 2023-01-16 ENCOUNTER — Ambulatory Visit (INDEPENDENT_AMBULATORY_CARE_PROVIDER_SITE_OTHER): Payer: Medicaid Other | Admitting: Dermatology

## 2023-01-16 VITALS — BP 110/73

## 2023-01-16 DIAGNOSIS — Z7189 Other specified counseling: Secondary | ICD-10-CM

## 2023-01-16 DIAGNOSIS — Z79899 Other long term (current) drug therapy: Secondary | ICD-10-CM | POA: Diagnosis not present

## 2023-01-16 DIAGNOSIS — L209 Atopic dermatitis, unspecified: Secondary | ICD-10-CM

## 2023-01-16 DIAGNOSIS — L2089 Other atopic dermatitis: Secondary | ICD-10-CM

## 2023-01-16 MED ORDER — DUPIXENT 300 MG/2ML ~~LOC~~ SOAJ: 300.0000 mg | SUBCUTANEOUS | 6 refills | Status: DC

## 2023-01-16 MED ORDER — EUCRISA 2 % EX OINT: 1.0000 | TOPICAL_OINTMENT | Freq: Every day | CUTANEOUS | 6 refills | Status: DC

## 2023-01-16 MED ORDER — TACROLIMUS 0.1 % EX OINT: TOPICAL_OINTMENT | Freq: Every day | CUTANEOUS | 6 refills | Status: DC

## 2023-01-16 NOTE — Patient Instructions (Addendum)
Continue Clobetasol cream decrease to 3 days a week as needed for flares, avoid face, groin, axilla Continue Tacrolimus ointment once daily,7 days a week to affected areas of eczema face and body Start Eucrisa ointment once daily 7 days a week to affected areas of eczema face and body    Due to recent changes in healthcare laws, you may see results of your pathology and/or laboratory studies on MyChart before the doctors have had a chance to review them. We understand that in some cases there may be results that are confusing or concerning to you. Please understand that not all results are received at the same time and often the doctors may need to interpret multiple results in order to provide you with the best plan of care or course of treatment. Therefore, we ask that you please give [place] 2 business days to thoroughly review all your results before contacting the office for clarification. Should we see a critical lab result, you will be contacted sooner.   If You Need Anything After Your Visit  If you have any questions or concerns for your doctor, please call our main line at [phone] and press option 4 to reach your doctor's medical assistant. If no one answers, please leave a voicemail as directed and we will return your call as soon as possible. Messages left after 4 pm will be answered the following business day.   You may also send [place] a message via MyChart. We typically respond to MyChart messages within 1-2 business days.  For prescription refills, please ask your pharmacy to contact our office. Our fax number is [phone].  If you have an urgent issue when the clinic is closed that cannot wait until the next business day, you can page your doctor at the number below.    Please note that while we do our best to be available for urgent issues outside of office hours, we are not available 24/7.   If you have an urgent issue and are unable to reach [place], you may choose to seek medical  care at your doctor's office, retail clinic, urgent care center, or emergency room.  If you have a medical emergency, please immediately call 911 or go to the emergency department.  Pager Numbers  - Dr. Gwen Pounds: [phone]  - Dr. Neale Burly: [phone]  - Dr. Roseanne Reno: [phone]  In the event of inclement weather, please call our main line at [phone] for an update on the status of any delays or closures.  Dermatology Medication Tips: Please keep the boxes that topical medications come in in order to help keep track of the instructions about where and how to use these. Pharmacies typically print the medication instructions only on the boxes and not directly on the medication tubes.   If your medication is too expensive, please contact our office at [phone] option 4 or send [place] a message through MyChart.   We are unable to tell what your co-pay for medications will be in advance as this is different depending on your insurance coverage. However, we may be able to find a substitute medication at lower cost or fill out paperwork to get insurance to cover a needed medication.   If a prior authorization is required to get your medication covered by your insurance company, please allow [place] 1-2 business days to complete this process.  Drug prices often vary depending on where the prescription is filled and some pharmacies may offer cheaper prices.  The website www.goodrx.com contains coupons for medications  through different pharmacies. The prices here do not account for what the cost may be with help from insurance (it may be cheaper with your insurance), but the website can give you the price if you did not use any insurance.  - You can print the associated coupon and take it with your prescription to the pharmacy.  - You may also stop by our office during regular business hours and pick up a GoodRx coupon card.  - If you need your prescription sent electronically to a different  pharmacy, notify our office through [facility] or by phone at [phone] option 4.     Si Usted Necesita Algo Despus de Su Visita  Tambin puede enviarnos un mensaje a travs de Clinical cytogeneticist. Por lo general respondemos a los mensajes de MyChart en el transcurso de 1 a 2 das hbiles.  Para renovar recetas, por favor pida a su farmacia que se ponga en contacto con nuestra oficina. Annie Sable de fax es [place] [phone].  Si tiene un asunto urgente cuando la clnica est cerrada y que no puede esperar hasta el siguiente da hbil, puede llamar/localizar a su doctor(a) al nmero que aparece a continuacin.   Por favor, tenga en cuenta que aunque hacemos todo lo posible para estar disponibles para asuntos urgentes fuera del horario de [place], no estamos disponibles las 24 horas del da, los 7 [address]  [address] de la [place].   Si tiene un problema urgente y no puede comunicarse con nosotros, puede optar por buscar atencin mdica  en el consultorio de su doctor(a), en una clnica privada, en un centro de atencin urgente o en una sala de emergencias.  Si tiene Engineer, drilling, por favor llame inmediatamente al 911 o vaya a la sala de emergencias.  Nmeros de bper  - Dr. Gwen Pounds: [phone]  - Dra. Moye: [phone]  - Dra. Roseanne Reno: [phone]  En caso de inclemencias del [place], por favor llame a Lacy Duverney principal al [phone] para una actualizacin sobre el [place] de cualquier retraso o cierre.  Consejos para la medicacin en dermatologa: Por favor, guarde las cajas en las que vienen los medicamentos de uso tpico para ayudarle a seguir las instrucciones sobre dnde y cmo usarlos. Las farmacias generalmente imprimen las instrucciones del medicamento slo en las cajas y no directamente en los tubos del [place].   Si su medicamento es muy caro, por favor, pngase en contacto con Rolm Gala llamando al [phone] y presione la opcin 4 o envenos un mensaje a  travs de Clinical cytogeneticist.   No podemos decirle cul ser su copago por los medicamentos por adelantado ya que esto es diferente dependiendo de la cobertura de su seguro. Sin embargo, es posible que podamos encontrar un medicamento sustituto a Audiological scientist un formulario para que el seguro cubra el medicamento que se considera necesario.   Si se requiere una autorizacin previa para que su compaa de seguros [place] su medicamento, por favor permtanos de 1 a 2 das hbiles para completar [address]  Los precios de los medicamentos varan con frecuencia dependiendo del Environmental consultant de dnde se surte la receta y alguna farmacias pueden ofrecer precios ms baratos.  El sitio web www.goodrx.com tiene cupones para medicamentos de Health and safety inspector. Los precios aqu no tienen en cuenta lo que podra costar con la ayuda del seguro (puede ser ms barato con su seguro), pero el sitio web puede darle el precio si no utiliz Tourist information centre manager.  - Puede imprimir el cupn correspondiente y llevarlo  con su receta a la farmacia.  - Tambin puede pasar por nuestra oficina durante el horario de atencin regular y Education officer, museum una tarjeta de cupones de GoodRx.  - Si necesita que su receta se enve electrnicamente a una farmacia diferente, informe a nuestra oficina a travs de MyChart de [place] o por telfono llamando al [phone] y presione la opcin 4.

## 2023-01-16 NOTE — Progress Notes (Signed)
   Follow-Up Visit   Subjective  Breanna Nguyen is a 27 y.o. female who presents for the following: Atopic Dermatitis hands, Dupixent sq injections q 2 wks, Clobetasol cr qd to aa, Tacrolimus qd  Patient accompanied by mother.  The following portions of the chart were reviewed this encounter and updated as appropriate: medications, allergies, medical history  Review of Systems:  No other skin or systemic complaints except as noted in HPI or Assessment and Plan.  Objective  Well appearing patient in no apparent distress; mood and affect are within normal limits.  Areas Examined: hands  Relevant physical exam findings are noted in the Assessment and Plan.    Assessment & Plan      ATOPIC DERMATITIS Hands now, previously trunk, extremitates, face Exam: hyperlinear palms, xerosis hands, peeling fingers 5% BSA  Chronic and persistent condition with duration or expected duration over one year. Condition is symptomatic/ bothersome to patient. Improve bu not currently at goal.   Atopic dermatitis - Severe, on Dupixent (biologic medication).  Atopic dermatitis (eczema) is a chronic, relapsing, pruritic condition that can significantly affect quality of life. It is often associated with allergic rhinitis and/or asthma and can require treatment with topical medications, phototherapy, or in severe cases biologic medications, which require long term medication management.    Treatment Plan:  Continue Dupixent 300 mg/19mL SQ QOW. Patient denies side effects. Cont Clobetasol cr decrease to 3d/wk prn flares, avoid f/g/a  Cont Tacrolimus qd 7 days a week to aa eczema Start Eucrisa oint qd 7 days a week to aa eczema   Potential side effects include allergic reaction, herpes infections, injection site reactions and conjunctivitis (inflammation of the eyes).  The use of Dupixent requires long term medication management, including periodic office visits.    Topical steroids (such as  triamcinolone, fluocinolone, fluocinonide, mometasone, clobetasol, halobetasol, betamethasone, hydrocortisone) can cause thinning and lightening of the skin if they are used for too long in the same area. Your physician has selected the right strength medicine for your problem and area affected on the body. Please use your medication only as directed by your physician to prevent side effects.    Long term medication management.  Patient is using long term (months to years) prescription medication  to control their dermatologic condition.  These medications require periodic monitoring to evaluate for efficacy and side effects and may require periodic laboratory monitoring.   Recommend gentle skin care.   Return in about 6 months (around 07/19/2023) for Atopic Derm.  I, Ardis Rowan, RMA, am acting as scribe for Armida Sans, MD .   Documentation: I have reviewed the above documentation for accuracy and completeness, and I agree with the above.  Armida Sans, MD

## 2023-01-17 ENCOUNTER — Encounter: Payer: Self-pay | Admitting: Dermatology

## 2023-03-27 DIAGNOSIS — Z124 Encounter for screening for malignant neoplasm of cervix: Secondary | ICD-10-CM | POA: Diagnosis not present

## 2023-03-27 DIAGNOSIS — Z0000 Encounter for general adult medical examination without abnormal findings: Secondary | ICD-10-CM | POA: Diagnosis not present

## 2023-03-27 DIAGNOSIS — Z113 Encounter for screening for infections with a predominantly sexual mode of transmission: Secondary | ICD-10-CM | POA: Diagnosis not present

## 2023-05-06 ENCOUNTER — Ambulatory Visit: Payer: Medicaid Other | Admitting: Nurse Practitioner

## 2023-05-06 ENCOUNTER — Ambulatory Visit: Payer: Medicaid Other | Admitting: Family Medicine

## 2023-05-14 ENCOUNTER — Ambulatory Visit: Payer: Medicaid Other | Admitting: Family Medicine

## 2023-06-18 ENCOUNTER — Ambulatory Visit: Payer: Medicaid Other | Admitting: Family Medicine

## 2023-07-14 ENCOUNTER — Encounter: Payer: Self-pay | Admitting: Dermatology

## 2023-07-14 ENCOUNTER — Ambulatory Visit: Payer: Medicaid Other | Admitting: Dermatology

## 2023-07-14 DIAGNOSIS — L209 Atopic dermatitis, unspecified: Secondary | ICD-10-CM

## 2023-07-14 DIAGNOSIS — Z79899 Other long term (current) drug therapy: Secondary | ICD-10-CM | POA: Diagnosis not present

## 2023-07-14 DIAGNOSIS — L2081 Atopic neurodermatitis: Secondary | ICD-10-CM

## 2023-07-14 DIAGNOSIS — Z7189 Other specified counseling: Secondary | ICD-10-CM

## 2023-07-14 MED ORDER — DUPIXENT 300 MG/2ML ~~LOC~~ SOAJ
300.0000 mg | SUBCUTANEOUS | 6 refills | Status: DC
Start: 2023-07-14 — End: 2024-01-22

## 2023-07-14 MED ORDER — CLOBETASOL PROPIONATE 0.05 % EX CREA: 1.0000 | TOPICAL_CREAM | CUTANEOUS | 3 refills | Status: DC

## 2023-07-14 NOTE — Progress Notes (Signed)
   Follow-Up Visit   Subjective  Breanna Nguyen is a 28 y.o. female who presents for the following: Atopic Dermatitis at hands. Patient started Dupixent  03/2022.  Patient self-injecting at home, tolerating well and feeling fine. Patient did not get Eucrisa . She is using clobetasol  cr about once a week and tacrolimus .   The following portions of the chart were reviewed this encounter and updated as appropriate: medications, allergies, medical history  Review of Systems:  No other skin or systemic complaints except as noted in HPI or Assessment and Plan.  Objective  Well appearing patient in no apparent distress; mood and affect are within normal limits.  Areas Examined: hands  Relevant physical exam findings are noted in the Assessment and Plan.    Assessment & Plan   ATOPIC DERMATITIS, UNSPECIFIED TYPE   Related Medications Dupilumab  (DUPIXENT ) 300 MG/2ML SOAJ Inject 300 mg into the skin every 14 (fourteen) days. Starting at day 15 for maintenance. LONG-TERM USE OF HIGH-RISK MEDICATION   Related Medications Dupilumab  (DUPIXENT ) 300 MG/2ML SOAJ Inject 300 mg into the skin every 14 (fourteen) days. Starting at day 15 for maintenance. ATOPIC NEURODERMATITIS   Related Medications clobetasol  cream (TEMOVATE ) 0.05 % Apply 1 Application topically as directed. Qd up to 5 days a week to affected areas of eczema on body, avoid face, groin, axilla   ATOPIC DERMATITIS Exam: hyperlinear palms, some xerosis at hands 5% BSA  Chronic and persistent condition with duration or expected duration over one year. Condition is improving with treatment but not currently at goal.   Atopic dermatitis - Severe, on Dupixent  (biologic medication).  Atopic dermatitis (eczema) is a chronic, relapsing, pruritic condition that can significantly affect quality of life. It is often associated with allergic rhinitis and/or asthma and can require treatment with topical medications, phototherapy, or in  severe cases biologic medications, which require long term medication management.    Treatment Plan:  Continue Dupixent  300 mg/2mL SQ QOW. Patient denies side effects. Continue Clobetasol  0.05% cream to aa's BID PRN to more severe areas. Topical steroids (such as triamcinolone, fluocinolone, fluocinonide, mometasone , clobetasol , halobetasol, betamethasone, hydrocortisone) can cause thinning and lightening of the skin if they are used for too long in the same area. Your physician has selected the right strength medicine for your problem and area affected on the body. Please use your medication only as directed by your physician to prevent side effects.  Continue tacrolimus  twice daily as needed.  Potential side effects include allergic reaction, herpes infections, injection site reactions and conjunctivitis (inflammation of the eyes).  The use of Dupixent  requires long term medication management, including periodic office visits.    Long term medication management.  Patient is using long term (months to years) prescription medication  to control their dermatologic condition.  These medications require periodic monitoring to evaluate for efficacy and side effects and may require periodic laboratory monitoring.   Recommend gentle skin care.  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   Discussed Rinvoq.   Return in about 6 months (around 01/11/2024) for Eczema, Dupixent .  LILLETTE Lonell Drones, RMA, am acting as scribe for Alm Rhyme, MD .   Documentation: I have reviewed the above documentation for accuracy and completeness, and I agree with the above.  Alm Rhyme, MD

## 2023-07-14 NOTE — Patient Instructions (Signed)
 Continue Dupixent  300 mg/2mL every 2 weeks.  Continue Clobetasol  0.05% cream to aa's BID PRN to more severe areas. Topical steroids (such as triamcinolone, fluocinolone, fluocinonide, mometasone , clobetasol , halobetasol, betamethasone, hydrocortisone) can cause thinning and lightening of the skin if they are used for too long in the same area. Your physician has selected the right strength medicine for your problem and area affected on the body. Please use your medication only as directed by your physician to prevent side effects.   Continue tacrolimus  twice daily as needed.  Potential side effects include allergic reaction, herpes infections, injection site reactions and conjunctivitis (inflammation of the eyes).  The use of Dupixent  requires long term medication management, including periodic office visits.    Due to recent changes in healthcare laws, you may see results of your pathology and/or laboratory studies on MyChart before the doctors have had a chance to review them. We understand that in some cases there may be results that are confusing or concerning to you. Please understand that not all results are received at the same time and often the doctors may need to interpret multiple results in order to provide you with the best plan of care or course of treatment. Therefore, we ask that you please give us  2 business days to thoroughly review all your results before contacting the office for clarification. Should we see a critical lab result, you will be contacted sooner.   If You Need Anything After Your Visit  If you have any questions or concerns for your doctor, please call our main line at [phone] and press option 4 to reach your doctor's medical assistant. If no one answers, please leave a voicemail as directed and we will return your call as soon as possible. Messages left after 4 pm will be answered the following business day.   You may also send us  a message via MyChart. We  typically respond to MyChart messages within 1-2 business days.  For prescription refills, please ask your pharmacy to contact our office. Our fax number is [phone].  If you have an urgent issue when the clinic is closed that cannot wait until the next business day, you can page your doctor at the number below.    Please note that while we do our best to be available for urgent issues outside of office hours, we are not available 24/7.   If you have an urgent issue and are unable to reach us , you may choose to seek medical care at your doctor's office, retail clinic, urgent care center, or emergency room.  If you have a medical emergency, please immediately call 911 or go to the emergency department.  Pager Numbers  - Dr. Hester: [phone]  - Dr. Jackquline: [phone]  - Dr. Claudene: [phone]   In the event of inclement weather, please call our main line at [phone] for an update on the status of any delays or closures.  Dermatology Medication Tips: Please keep the boxes that topical medications come in in order to help keep track of the instructions about where and how to use these. Pharmacies typically print the medication instructions only on the boxes and not directly on the medication tubes.   If your medication is too expensive, please contact our office at [phone] option 4 or send us  a message through MyChart.   We are unable to tell what your co-pay for medications will be in advance as this is different depending on your insurance coverage. However, we may be able to  find a substitute medication at lower cost or fill out paperwork to get insurance to cover a needed medication.   If a prior authorization is required to get your medication covered by your insurance company, please allow us  1-2 business days to complete this process.  Drug prices often vary depending on where the prescription is filled and some pharmacies may offer cheaper prices.  The  website www.goodrx.com contains coupons for medications through different pharmacies. The prices here do not account for what the cost may be with help from insurance (it may be cheaper with your insurance), but the website can give you the price if you did not use any insurance.  - You can print the associated coupon and take it with your prescription to the pharmacy.  - You may also stop by our office during regular business hours and pick up a GoodRx coupon card.  - If you need your prescription sent electronically to a different pharmacy, notify our office through [facility] or by phone at [phone] option 4.     Si Usted Necesita Algo Despus de Su Visita  Tambin puede enviarnos un mensaje a travs de Clinical Cytogeneticist. Por lo general respondemos a los mensajes de MyChart en el transcurso de 1 a 2 das hbiles.  Para renovar recetas, por favor pida a su farmacia que se ponga en contacto con nuestra oficina. Randi lakes de fax es [place] [phone].  Si tiene un asunto urgente cuando la clnica est cerrada y que no puede esperar hasta el siguiente da hbil, puede llamar/localizar a su doctor(a) al nmero que aparece a continuacin.   Por favor, tenga en cuenta que aunque hacemos todo lo posible para estar disponibles para asuntos urgentes fuera del horario de [place], no estamos disponibles las 24 horas del da, los 7 [address]  [address] de la [place].   Si tiene un problema urgente y no puede comunicarse con nosotros, puede optar por buscar atencin mdica  en el consultorio de su doctor(a), en una clnica privada, en un centro de atencin urgente o en una sala de emergencias.  Si tiene engineer, drilling, por favor llame inmediatamente al 911 o vaya a la sala de emergencias.  Nmeros de bper  - Dr. Hester: [phone]  - Dra. Jackquline: [phone]  - Dr. Claudene: [phone]   En caso de inclemencias del tiempo, por favor llame a landry capes principal al [phone] para una  actualizacin sobre el [place] de cualquier retraso o cierre.  Consejos para la medicacin en dermatologa: Por favor, guarde las cajas en las que vienen los medicamentos de uso tpico para ayudarle a seguir las instrucciones sobre dnde y cmo usarlos. Las farmacias generalmente imprimen las instrucciones del medicamento slo en las cajas y no directamente en los tubos del [place].   Si su medicamento es muy caro, por favor, pngase en contacto con landry rieger llamando al [phone] y presione la opcin 4 o envenos un mensaje a travs de Clinical Cytogeneticist.   No podemos decirle cul ser su copago por los medicamentos por adelantado ya que esto es diferente dependiendo de la cobertura de su seguro. Sin embargo, es posible que podamos encontrar un medicamento sustituto a audiological scientist un formulario para que el seguro cubra el medicamento que se considera necesario.   Si se requiere una autorizacin previa para que su compaa de seguros cubra su medicamento, por favor permtanos de 1 a 2 das hbiles para completar este proceso.  Los precios de los medicamentos varan con frecuencia  dependiendo del lugar de dnde se surte la receta y alguna farmacias pueden ofrecer precios ms baratos.  El sitio web www.goodrx.com tiene cupones para medicamentos de health and safety inspector. Los precios aqu no tienen en cuenta lo que podra costar con la ayuda del seguro (puede ser ms barato con su seguro), pero el sitio web puede darle el precio si no utiliz tourist information centre manager.  - Puede imprimir el cupn correspondiente y llevarlo con su receta a la farmacia.  - Tambin puede pasar por nuestra oficina durante el horario de atencin regular y education officer, museum una tarjeta de cupones de GoodRx.  - Si necesita que su receta se enve electrnicamente a una farmacia diferente, informe a nuestra oficina a travs de MyChart de [place] o por telfono llamando al [phone] y presione la opcin 4.

## 2023-07-16 ENCOUNTER — Ambulatory Visit: Payer: Medicaid Other | Admitting: Dermatology

## 2023-08-14 ENCOUNTER — Encounter: Payer: Self-pay | Admitting: Family Medicine

## 2023-12-09 ENCOUNTER — Ambulatory Visit: Admitting: Family Medicine

## 2023-12-09 ENCOUNTER — Ambulatory Visit

## 2023-12-09 VITALS — BP 107/72 | HR 54 | Ht 62.0 in | Wt 146.1 lb

## 2023-12-09 DIAGNOSIS — H65111 Acute and subacute allergic otitis media (mucoid) (sanguinous) (serous), right ear: Secondary | ICD-10-CM

## 2023-12-09 DIAGNOSIS — J302 Other seasonal allergic rhinitis: Secondary | ICD-10-CM | POA: Diagnosis not present

## 2023-12-09 DIAGNOSIS — H6690 Otitis media, unspecified, unspecified ear: Secondary | ICD-10-CM | POA: Insufficient documentation

## 2023-12-09 MED ORDER — AMOXICILLIN-POT CLAVULANATE 875-125 MG PO TABS: 1.0000 | ORAL_TABLET | Freq: Two times a day (BID) | ORAL | 0 refills | Status: AC

## 2023-12-09 NOTE — Progress Notes (Signed)
 Acute Office Visit  Subjective:     Patient ID: Breanna Nguyen, female    DOB: 08-Jan-1996, 28 y.o.   MRN: [id number]  Chief Complaint  Patient presents with   Otitis Media    Onset:    HPI  Breanna Nguyen is a 28 y.o. female who presents to the clinic today for an approximately 10-day history of right-sided ear pain.  She reports that the pain radiates down the right side of her neck.  She describes the pain as throbbing in nature.  She also endorses some recent symptoms of seasonal allergies which she has not taken anything over-the-counter for.  The additional symptoms include sneezing, post nasal drainage, and cough.  Patient also reports that 4 days ago she got a popcorn kernel stuck in her back that she was on the right side, this which has caused her some upper jaw pain as well.  She also reports that this pain improves with Advil .  She reports that she has a dentist appointment scheduled tomorrow for the dentist to examine this tooth.  She denies fever, wheezing, shortness of breath, chest pain.   ROS Per HPI     Objective:    BP 107/72   Pulse (!) 54   Ht 5\' 2"  (1.575 m)   Wt 146 lb 1.3 oz (66.3 kg)   SpO2 97%   BMI 26.72 kg/m    Physical Exam Constitutional:      Appearance: Normal appearance.  HENT:     Right Ear: External ear normal. Tympanic membrane is erythematous and bulging.     Left Ear: External ear normal. A middle ear effusion is present. Tympanic membrane is erythematous. Tympanic membrane is not bulging.     Nose: Rhinorrhea present.     Mouth/Throat:     Dentition: Normal dentition. No gingival swelling or dental abscesses.     Pharynx: Posterior oropharyngeal erythema (+PND) present.  Cardiovascular:     Rate and Rhythm: Normal rate and regular rhythm.  Pulmonary:     Effort: Pulmonary effort is normal.     Breath sounds: Normal breath sounds.  Lymphadenopathy:     Cervical: Cervical adenopathy (anterior cervical nodes palpable on right  side, soft, mobile, and tender.) present.  Skin:    General: Skin is warm and dry.  Neurological:     General: No focal deficit present.     Mental Status: She is alert.  Psychiatric:        Mood and Affect: Mood normal.        Behavior: Behavior normal.        Thought Content: Thought content normal.    No results found for any visits on 12/09/23.      Assessment & Plan:   Right sided acute otitis media  --Start Augmentin 875-125 BID x 7 days. Advised patient to take with food to avoid GI side effects. Advised patient to complete entire course of antibiotics.  Instructed patient to schedule follow-up if symptoms worsen or do not improve after course of antibiotics.   Seasonal Allergies --Advised patient to take over-the-counter antihistamine (Zyrtec, Allegra, Xyzal) daily for at least 2 weeks. Also advised patient to try over-the-counter Flonase nasal spray once daily for 2 weeks to control allergy symptoms. If symptoms worsen or do not improve, patient was instructed to schedule follow-up.       Return in about 1 month (around 01/08/2024) for Physical.  Odilia Bennett, PA-C

## 2023-12-09 NOTE — Progress Notes (Deleted)
   [facility] ACUTE OFFICE VISIT NOTE  History:  Breanna Nguyen is a 28 y.o. G1P1001 here today for ear pain. Patient presents with {left/right/bi:30031} ear pain.  Symptoms include {otitis symptoms:327}. Symptoms began {numbers; 0-10:33138} {unit:11} ago and are {course:17::"unchanged"} since that time. Patient denies {respiratory symptoms:16811}. Ear history: {numbers; 0-10:33138} previous ear infections.   Health Maintenance Due  Topic Date Due   COVID-19 Vaccine (1) Never done   Pneumococcal Vaccine 98-73 Years old (1 of 2 - PCV) Never done    Past Medical History:  Diagnosis Date   GERD (gastroesophageal reflux disease)    Gestational diabetes    metformin    Past Surgical History:  Procedure Laterality Date   NO PAST SURGERIES      The following portions of the patient's history were reviewed and updated as appropriate: allergies, current medications, past family history, past medical history, past social history, past surgical history and problem list.   Health Maintenance:   Last pap: Lab Results  Component Value Date   DIAGPAP  07/03/2021    - Negative for intraepithelial lesion or malignancy (NILM)    Review of Systems:  Pertinent items noted in HPI and remainder of comprehensive ROS otherwise negative.  Physical Exam:  There were no vitals taken for this visit. CONSTITUTIONAL: Well-developed, well-nourished female in no acute distress.  HEENT:  Normocephalic, atraumatic. External right and left ear normal. No scleral icterus.  NECK: Normal range of motion, supple, no masses noted on observation SKIN: No rash noted. Not diaphoretic. No erythema. No pallor. MUSCULOSKELETAL: Normal range of motion. No edema noted. NEUROLOGIC: Alert and oriented to person, place, and time. Normal muscle tone coordination.  PSYCHIATRIC: Normal mood and affect. Normal behavior. Normal judgment and thought content. RESPIRATORY: Effort normal, no problems with respiration  noted  Assessment and Plan:  There are no diagnoses linked to this encounter.    No follow-ups on file.    Total face-to-face time with patient: {Blank single:19197::"10","15","20","25","30"} minutes.  Over 50% of encounter was spent on counseling and coordination of care.   Noreene Bearded, PA

## 2023-12-09 NOTE — Assessment & Plan Note (Signed)
-  Start Augmentin 875-125 BID x 7 days. Advised patient to take with food to avoid GI side effects. Advised patient to complete entire course of antibiotics.  Instructed patient to schedule follow-up if symptoms worsen or do not improve after course of antibiotics.

## 2023-12-09 NOTE — Assessment & Plan Note (Signed)
 Advised patient to take over-the-counter antihistamine (Zyrtec, Allegra, Xyzal) daily for at least 2 weeks.  Also advised patient to try over-the-counter Flonase nasal spray once daily for 2 weeks to control allergy symptoms.  If symptoms worsen or do not improve, patient was instructed to schedule follow-up.

## 2023-12-09 NOTE — Patient Instructions (Signed)
 It was nice to see you today!  As we discussed in clinic   -I have sent in Augmentin to your preferred pharmacy to take twice a day for 7 days to treat your ear infection.  Please remember to take this medication with food as it can cause nausea or upset stomach. - I recommend doing Flonase nasal spray once a day for 2 weeks to reduce the inflammation in your sinus cavities. - I also recommend taking a daily allergy pill (Allegra, Zyrtec, Xyzal etc.) for at least 2 weeks to treat your allergy symptoms. - The store brand of the nasal spray and the allergy pill will work just fine. -If symptoms worsen or fail to improve after the course of antibiotics, please send me a MyChart message or call the office to schedule a follow-up visit.  If you have any problems before your next visit feel free to message me via MyChart (minor issues or questions) or call the office, otherwise you may reach out to schedule an office visit.  Thank you! Meryl Acosta, PA-C

## 2024-01-14 ENCOUNTER — Ambulatory Visit: Payer: Medicaid Other | Admitting: Dermatology

## 2024-01-20 ENCOUNTER — Other Ambulatory Visit: Payer: Self-pay

## 2024-01-20 DIAGNOSIS — Z130 Encounter for screening for diseases of the blood and blood-forming organs and certain disorders involving the immune mechanism: Secondary | ICD-10-CM

## 2024-01-20 DIAGNOSIS — Z6826 Body mass index (BMI) 26.0-26.9, adult: Secondary | ICD-10-CM

## 2024-01-21 ENCOUNTER — Other Ambulatory Visit

## 2024-01-21 DIAGNOSIS — Z1321 Encounter for screening for nutritional disorder: Secondary | ICD-10-CM | POA: Diagnosis not present

## 2024-01-21 DIAGNOSIS — Z13228 Encounter for screening for other metabolic disorders: Secondary | ICD-10-CM | POA: Diagnosis not present

## 2024-01-21 DIAGNOSIS — Z1329 Encounter for screening for other suspected endocrine disorder: Secondary | ICD-10-CM | POA: Diagnosis not present

## 2024-01-21 DIAGNOSIS — Z6826 Body mass index (BMI) 26.0-26.9, adult: Secondary | ICD-10-CM

## 2024-01-21 DIAGNOSIS — R7301 Impaired fasting glucose: Secondary | ICD-10-CM | POA: Diagnosis not present

## 2024-01-21 DIAGNOSIS — Z130 Encounter for screening for diseases of the blood and blood-forming organs and certain disorders involving the immune mechanism: Secondary | ICD-10-CM | POA: Diagnosis not present

## 2024-01-22 ENCOUNTER — Ambulatory Visit: Payer: Self-pay

## 2024-01-22 ENCOUNTER — Ambulatory Visit: Admitting: Dermatology

## 2024-01-22 DIAGNOSIS — Z79899 Other long term (current) drug therapy: Secondary | ICD-10-CM | POA: Diagnosis not present

## 2024-01-22 DIAGNOSIS — L309 Dermatitis, unspecified: Secondary | ICD-10-CM | POA: Diagnosis not present

## 2024-01-22 DIAGNOSIS — L209 Atopic dermatitis, unspecified: Secondary | ICD-10-CM | POA: Diagnosis not present

## 2024-01-22 DIAGNOSIS — L299 Pruritus, unspecified: Secondary | ICD-10-CM

## 2024-01-22 DIAGNOSIS — L2081 Atopic neurodermatitis: Secondary | ICD-10-CM

## 2024-01-22 DIAGNOSIS — Z7189 Other specified counseling: Secondary | ICD-10-CM

## 2024-01-22 LAB — CBC WITH DIFFERENTIAL/PLATELET
Basophils Absolute: 0 x10E3/uL (ref 0.0–0.2)
Basos: 0 %
EOS (ABSOLUTE): 0.1 x10E3/uL (ref 0.0–0.4)
Eos: 1 %
Hematocrit: 43.7 % (ref 34.0–46.6)
Hemoglobin: 14 g/dL (ref 11.1–15.9)
Immature Grans (Abs): 0 x10E3/uL (ref 0.0–0.1)
Immature Granulocytes: 0 %
Lymphocytes Absolute: 2.4 x10E3/uL (ref 0.7–3.1)
Lymphs: 22 %
MCH: 29.7 pg (ref 26.6–33.0)
MCHC: 32 g/dL (ref 31.5–35.7)
MCV: 93 fL (ref 79–97)
Monocytes Absolute: 0.6 x10E3/uL (ref 0.1–0.9)
Monocytes: 5 %
Neutrophils Absolute: 8 x10E3/uL — ABNORMAL HIGH (ref 1.4–7.0)
Neutrophils: 72 %
Platelets: 309 x10E3/uL (ref 150–450)
RBC: 4.71 x10E6/uL (ref 3.77–5.28)
RDW: 12.8 % (ref 11.7–15.4)
WBC: 11.1 x10E3/uL — ABNORMAL HIGH (ref 3.4–10.8)

## 2024-01-22 LAB — LIPID PANEL
Chol/HDL Ratio: 5.6 ratio — ABNORMAL HIGH (ref 0.0–4.4)
Cholesterol, Total: 192 mg/dL (ref 100–199)
HDL: 34 mg/dL — ABNORMAL LOW (ref >39)
LDL Chol Calc (NIH): 136 mg/dL — ABNORMAL HIGH (ref 0–99)
Triglycerides: 118 mg/dL (ref 0–149)
VLDL Cholesterol Cal: 22 mg/dL (ref 5–40)

## 2024-01-22 LAB — COMPREHENSIVE METABOLIC PANEL WITH GFR
ALT: 10 IU/L (ref 0–32)
AST: 15 IU/L (ref 0–40)
Albumin: 4.7 g/dL (ref 4.0–5.0)
Alkaline Phosphatase: 73 IU/L (ref 44–121)
BUN/Creatinine Ratio: 13 (ref 9–23)
BUN: 11 mg/dL (ref 6–20)
Bilirubin Total: 0.7 mg/dL (ref 0.0–1.2)
CO2: 21 mmol/L (ref 20–29)
Calcium: 9.4 mg/dL (ref 8.7–10.2)
Chloride: 102 mmol/L (ref 96–106)
Creatinine, Ser: 0.83 mg/dL (ref 0.57–1.00)
Globulin, Total: 3.3 g/dL (ref 1.5–4.5)
Glucose: 113 mg/dL — ABNORMAL HIGH (ref 70–99)
Potassium: 3.9 mmol/L (ref 3.5–5.2)
Sodium: 140 mmol/L (ref 134–144)
Total Protein: 8 g/dL (ref 6.0–8.5)
eGFR: 98 mL/min/1.73 (ref >59)

## 2024-01-22 LAB — HEMOGLOBIN A1C
Est. average glucose Bld gHb Est-mCnc: 97 mg/dL
Hgb A1c MFr Bld: 5 % (ref 4.8–5.6)

## 2024-01-22 LAB — VITAMIN D 25 HYDROXY (VIT D DEFICIENCY, FRACTURES): Vit D, 25-Hydroxy: 46.2 ng/mL (ref 30.0–100.0)

## 2024-01-22 LAB — TSH: TSH: 2.09 u[IU]/mL (ref 0.450–4.500)

## 2024-01-22 MED ORDER — DUPIXENT 300 MG/2ML ~~LOC~~ SOAJ: 300.0000 mg | SUBCUTANEOUS | 6 refills | Status: AC

## 2024-01-22 MED ORDER — TACROLIMUS 0.1 % EX OINT: TOPICAL_OINTMENT | Freq: Every day | CUTANEOUS | 6 refills | Status: AC

## 2024-01-22 MED ORDER — CLOBETASOL PROPIONATE 0.05 % EX CREA: 1.0000 | TOPICAL_CREAM | CUTANEOUS | 3 refills | Status: AC

## 2024-01-22 MED ORDER — MOMETASONE FUROATE 0.1 % EX CREA: 1.0000 | TOPICAL_CREAM | Freq: Two times a day (BID) | CUTANEOUS | 6 refills | Status: AC

## 2024-01-22 MED ORDER — EUCRISA 2 % EX OINT: 1.0000 | TOPICAL_OINTMENT | Freq: Every day | CUTANEOUS | 6 refills | Status: AC

## 2024-01-22 NOTE — Progress Notes (Signed)
 Follow-Up Visit   Subjective  Breanna Nguyen is a 28 y.o. female who presents for the following: Atopic Dermatitis - doing well on Dupixent , tolerating with no side effects. Pt has recently flared due to handling Clorox tablets for her swimming pool. She currently uses tacrolimus  0.1% ointment, Eucrisa  2% ointment, mometasone  0.1% cream, and clobetasol  0.05% cream daily when needed. She doesn't use all of the everyday, just which ever topical she picks up from the dresser for flares.  The following portions of the chart were reviewed this encounter and updated as appropriate: medications, allergies, medical history  Review of Systems:  No other skin or systemic complaints except as noted in HPI or Assessment and Plan.  Objective  Well appearing patient in no apparent distress; mood and affect are within normal limits.  Areas Examined: The face and hands  Relevant physical exam findings are noted in the Assessment and Plan.   Assessment & Plan   ATOPIC NEURODERMATITIS   Related Medications clobetasol  cream (TEMOVATE ) 0.05 % Apply 1 Application topically as directed. Qd up to 5 days a week to affected areas of eczema on body, avoid face, groin, axilla ATOPIC DERMATITIS, UNSPECIFIED TYPE   Related Medications Dupilumab  (DUPIXENT ) 300 MG/2ML SOAJ Inject 300 mg into the skin every 14 (fourteen) days. Starting at day 15 for maintenance. LONG-TERM USE OF HIGH-RISK MEDICATION   Related Medications Dupilumab  (DUPIXENT ) 300 MG/2ML SOAJ Inject 300 mg into the skin every 14 (fourteen) days. Starting at day 15 for maintenance.   ATOPIC DERMATITIS with hand dermatitis and pruritus  Exam: Scaly pink papules coalescing to plaques 4% BSA  Chronic and persistent condition with duration or expected duration over one year. Condition is bothersome/symptomatic for patient. Currently flared from handling Clorox tablets.   Patient happy with treatment and would like to continue Dupixent   and topical treatment. Pt states no eye irritation or other side effects associated with Dupixent .  Atopic dermatitis (eczema) is a chronic, relapsing, pruritic condition that can significantly affect quality of life. It is often associated with allergic rhinitis and/or asthma and can require treatment with topical medications, phototherapy, or in severe cases biologic injectable medication (Dupixent ; Adbry) or Oral JAK inhibitors.  Treatment Plan: Continue Dupxient 300mg /34mL SQ QOW. Dupilumab  (Dupixent ) is a treatment given by injection for adults and children with moderate-to-severe atopic dermatitis. Goal is control of skin condition, not cure. It is given as 2 injections at the first dose followed by 1 injection ever 2 weeks thereafter.  Young children are dosed monthly.  Potential side effects include allergic reaction, herpes infections, injection site reactions and conjunctivitis (inflammation of the eyes).  The use of Dupixent  requires long term medication management, including periodic office visits.  Continue Eucrisa  2% ointment to aa's BID PRN and Tacrolimus  0.1% ointment QD-BID PRN. IF flare not controlled may use Mometasone  0.1% cream QD-BID up to 5d/wk or Clobetasol  0.05% cream QD-BID for significant flares. Topical steroids (such as triamcinolone, fluocinolone, fluocinonide, mometasone , clobetasol , halobetasol, betamethasone, hydrocortisone) can cause thinning and lightening of the skin if they are used for too long in the same area. Your physician has selected the right strength medicine for your problem and area affected on the body. Please use your medication only as directed by your physician to prevent side effects.   Recommend gentle skin care.   Return in about 6 months (around 07/24/2024) for atopic dermatitis follow up.  LILLETTE Rosina Mayans, CMA, am acting as scribe for Alm Rhyme, MD .   Documentation: I  have reviewed the above documentation for accuracy and completeness,  and I agree with the above.  Alm Rhyme, MD

## 2024-01-22 NOTE — Patient Instructions (Signed)
 Clobetasol  0.05% cream - strong topical steroid, use only for significant flares once to twice daily. Avoid applying to face, groin, and axilla. Use as directed. Long-term use can cause thinning of the skin.    Mometasone  0.1% cream is a medium potency steroid and you can use once to twice daily as needed for flares not controlled with Eucrisa  2% ointment or Tacrolimus  0.1% ointment.   Tacrolimus  0.1% ointment and Eucrisa  2% ointment are preferred topical anti-inflammatory treatment used for eczema and are safe to use once to twice daily everywhere on the face and body and do not cause skin atropy/thinning with extended use. You should use these once to twice daily as needed to keep condition well controlled.   Due to recent changes in healthcare laws, you may see results of your pathology and/or laboratory studies on MyChart before the doctors have had a chance to review them. We understand that in some cases there may be results that are confusing or concerning to you. Please understand that not all results are received at the same time and often the doctors may need to interpret multiple results in order to provide you with the best plan of care or course of treatment. Therefore, we ask that you please give us  2 business days to thoroughly review all your results before contacting the office for clarification. Should we see a critical lab result, you will be contacted sooner.   If You Need Anything After Your Visit  If you have any questions or concerns for your doctor, please call our main line at [phone] and press option 4 to reach your doctor's medical assistant. If no one answers, please leave a voicemail as directed and we will return your call as soon as possible. Messages left after 4 pm will be answered the following business day.   You may also send us  a message via MyChart. We typically respond to MyChart messages within 1-2 business days.  For prescription refills, please ask your  pharmacy to contact our office. Our fax number is [phone].  If you have an urgent issue when the clinic is closed that cannot wait until the next business day, you can page your doctor at the number below.    Please note that while we do our best to be available for urgent issues outside of office hours, we are not available 24/7.   If you have an urgent issue and are unable to reach us , you may choose to seek medical care at your doctor's office, retail clinic, urgent care center, or emergency room.  If you have a medical emergency, please immediately call 911 or go to the emergency department.  Pager Numbers  - Dr. Hester: [phone]  - Dr. Jackquline: [phone]  - Dr. Claudene: [phone]   In the event of inclement weather, please call our main line at [phone] for an update on the status of any delays or closures.  Dermatology Medication Tips: Please keep the boxes that topical medications come in in order to help keep track of the instructions about where and how to use these. Pharmacies typically print the medication instructions only on the boxes and not directly on the medication tubes.   If your medication is too expensive, please contact our office at [phone] option 4 or send us  a message through MyChart.   We are unable to tell what your co-pay for medications will be in advance as this is different depending on your insurance coverage. However, we may be able to  find a substitute medication at lower cost or fill out paperwork to get insurance to cover a needed medication.   If a prior authorization is required to get your medication covered by your insurance company, please allow us  1-2 business days to complete this process.  Drug prices often vary depending on where the prescription is filled and some pharmacies may offer cheaper prices.  The website www.goodrx.com contains coupons for medications through different pharmacies. The prices here do not  account for what the cost may be with help from insurance (it may be cheaper with your insurance), but the website can give you the price if you did not use any insurance.  - You can print the associated coupon and take it with your prescription to the pharmacy.  - You may also stop by our office during regular business hours and pick up a GoodRx coupon card.  - If you need your prescription sent electronically to a different pharmacy, notify our office through [facility] or by phone at [phone] option 4.     Si Usted Necesita Algo Despus de Su Visita  Tambin puede enviarnos un mensaje a travs de Clinical cytogeneticist. Por lo general respondemos a los mensajes de MyChart en el transcurso de 1 a 2 das hbiles.  Para renovar recetas, por favor pida a su farmacia que se ponga en contacto con nuestra oficina. Randi lakes de fax es [place] [phone].  Si tiene un asunto urgente cuando la clnica est cerrada y que no puede esperar hasta el siguiente da hbil, puede llamar/localizar a su doctor(a) al nmero que aparece a continuacin.   Por favor, tenga en cuenta que aunque hacemos todo lo posible para estar disponibles para asuntos urgentes fuera del horario de [place], no estamos disponibles las 24 horas del da, los 7 [address]  [address] de la [place].   Si tiene un problema urgente y no puede comunicarse con nosotros, puede optar por buscar atencin mdica  en el consultorio de su doctor(a), en una clnica privada, en un centro de atencin urgente o en una sala de emergencias.  Si tiene Engineer, drilling, por favor llame inmediatamente al 911 o vaya a la sala de emergencias.  Nmeros de bper  - Dr. Hester: [phone]  - Dra. Jackquline: [phone]  - Dr. Claudene: [phone]   En caso de inclemencias del tiempo, por favor llame a landry capes principal al [phone] para una actualizacin sobre el Rosita de cualquier retraso o cierre.  Consejos para la medicacin en dermatologa: Por  favor, guarde las cajas en las que vienen los medicamentos de uso tpico para ayudarle a seguir las instrucciones sobre dnde y cmo usarlos. Las farmacias generalmente imprimen las instrucciones del medicamento slo en las cajas y no directamente en los tubos del [place].   Si su medicamento es muy caro, por favor, pngase en contacto con landry rieger llamando al [phone] y presione la opcin 4 o envenos un mensaje a travs de Clinical cytogeneticist.   No podemos decirle cul ser su copago por los medicamentos por adelantado ya que esto es diferente dependiendo de la cobertura de su seguro. Sin embargo, es posible que podamos encontrar un medicamento sustituto a Audiological scientist un formulario para que el seguro cubra el medicamento que se considera necesario.   Si se requiere una autorizacin previa para que su compaa de seguros malta su medicamento, por favor permtanos de 1 a 2 das hbiles para completar este proceso.  Los precios de los medicamentos varan con frecuencia  dependiendo del lugar de dnde se surte la receta y alguna farmacias pueden ofrecer precios ms baratos.  El sitio web www.goodrx.com tiene cupones para medicamentos de Health and safety inspector. Los precios aqu no tienen en cuenta lo que podra costar con la ayuda del seguro (puede ser ms barato con su seguro), pero el sitio web puede darle el precio si no utiliz Tourist information centre manager.  - Puede imprimir el cupn correspondiente y llevarlo con su receta a la farmacia.  - Tambin puede pasar por nuestra oficina durante el horario de atencin regular y Education officer, museum una tarjeta de cupones de GoodRx.  - Si necesita que su receta se enve electrnicamente a una farmacia diferente, informe a nuestra oficina a travs de MyChart de  o por telfono llamando al [phone] y presione la opcin 4.

## 2024-01-27 ENCOUNTER — Ambulatory Visit (INDEPENDENT_AMBULATORY_CARE_PROVIDER_SITE_OTHER)

## 2024-01-27 ENCOUNTER — Encounter: Payer: Self-pay | Admitting: Dermatology

## 2024-01-27 VITALS — BP 106/70 | HR 44 | Temp 97.6°F | Ht 62.0 in | Wt 142.1 lb

## 2024-01-27 DIAGNOSIS — Z0000 Encounter for general adult medical examination without abnormal findings: Secondary | ICD-10-CM | POA: Insufficient documentation

## 2024-01-27 DIAGNOSIS — Z720 Tobacco use: Secondary | ICD-10-CM | POA: Diagnosis not present

## 2024-01-27 DIAGNOSIS — G4700 Insomnia, unspecified: Secondary | ICD-10-CM | POA: Diagnosis not present

## 2024-01-27 DIAGNOSIS — F411 Generalized anxiety disorder: Secondary | ICD-10-CM

## 2024-01-27 DIAGNOSIS — E559 Vitamin D deficiency, unspecified: Secondary | ICD-10-CM

## 2024-01-27 DIAGNOSIS — E782 Mixed hyperlipidemia: Secondary | ICD-10-CM | POA: Diagnosis not present

## 2024-01-27 DIAGNOSIS — E785 Hyperlipidemia, unspecified: Secondary | ICD-10-CM | POA: Insufficient documentation

## 2024-01-27 DIAGNOSIS — F410 Panic disorder [episodic paroxysmal anxiety] without agoraphobia: Secondary | ICD-10-CM

## 2024-01-27 MED ORDER — HYDROXYZINE HCL 25 MG PO TABS: ORAL_TABLET | ORAL | 2 refills | Status: AC

## 2024-01-27 MED ORDER — TRAZODONE HCL 50 MG PO TABS: 50.0000 mg | ORAL_TABLET | Freq: Every day | ORAL | 1 refills | Status: AC

## 2024-01-27 NOTE — Assessment & Plan Note (Signed)
 Went over lab work in detail with the patient. Answered all patient questions. Went over and encouraged/ordered age-appropriate health screenings to include pap smear. Patient is up-to-date on all other screenings. No vaccines today. Encouraged patient to aim for 150+ minutes of physical activity per week or just increase their physical activity in general in combination with a well balanced diet that prioritizes protein and fiber to support a healthy lifestyle. Will plan for next physical in 1 year, sooner PRN. Patient verbalized understanding and was in agreement with the plan.

## 2024-01-27 NOTE — Patient Instructions (Signed)
 It was nice to see you today!  As we discussed in clinic:  -All of your lab work looked great. We will continue to keep an eye on cholesterol and make sure that it improves or remains stable over time.  -I have sent in Trazodone  for you to take nightly to help with sleep. It is a 50 mg tablet, but you can split the pill in half to take 25 mg at first. If you feel like you need to take the full 50 mg, you can take the whole tablet instead.  -Please schedule your pap smear appointment at your convenience.  It was good to see you again!   If you have any problems before your next visit feel free to message me via MyChart (minor issues or questions) or call the office, otherwise you may reach out to schedule an office visit.  Thank you! Saddie Sacks, PA-C

## 2024-01-27 NOTE — Assessment & Plan Note (Signed)
 Stable.  Patient stopped her Lexapro  10 mg daily because it upset her stomach.  She is not interested in starting any new daily medications for mood at this time.  Hydroxyzine  25 mg as needed for acute anxiety attacks.  Will continue to monitor.

## 2024-01-27 NOTE — Assessment & Plan Note (Signed)
 Start trazodone  25 to 50 mg nightly as needed for insomnia.  Will continue to monitor.

## 2024-01-27 NOTE — Assessment & Plan Note (Signed)
 Most recent vitamin D  within normal limits.  Will continue to monitor.

## 2024-01-27 NOTE — Assessment & Plan Note (Signed)
 Last lipid panel: LDL 136, HDL 34, triglycerides 118. The ASCVD Risk score (Arnett DK, et al., 2019) failed to calculate for the following reasons:   The 2019 ASCVD risk score is only valid for ages 32 to 60 Patient is otherwise low risk and has lack of other risk factors including hypertension and diabetes, it is not indicated that we start a statin medication at this time.  Advised her to increase her daily physical activity through walking and work to reduce intake of high saturated/trans fat foods. Will cont to monitor lipid panel yearly.

## 2024-01-27 NOTE — Progress Notes (Signed)
 Complete physical exam  Patient: Breanna Nguyen   DOB: July 05, 1996   28 y.o. Female  MRN: [id number]  Subjective:    Chief Complaint  Patient presents with   Annual Exam    Physical    Breanna Nguyen is a 28 y.o. female who presents today for a complete physical exam. She reports consuming a general diet. The patient has a physically strenuous job, but has no regular exercise apart from work.  She generally feels well. Reports regular bowel movements. Reports regular periods. She is a mom to a 87-year-old toddler. She reports sleeping fairly well. Reports that she has difficulty in initiating sleep and turning her brain off at night. Reports that once she gets to sleep, she can sleep well. Would like to discuss medication options for sleep. She does not have additional problems to discuss today.    Most recent fall risk assessment:    01/27/2024    1:14 PM  Fall Risk   Falls in the past year? 0  Injury with Fall? 0  Risk for fall due to : No Fall Risks  Follow up Falls evaluation completed     Most recent depression screenings:    01/27/2024    1:15 PM 11/04/2022    9:20 AM  PHQ 2/9 Scores  PHQ - 2 Score 0 0  PHQ- 9 Score  0    Dental: No current dental problems and Receives regular dental care    Patient Care Team: Gayle Saddie JULIANNA DEVONNA as PCP - General (Physician Assistant)   Outpatient Medications Prior to Visit  Medication Sig   clobetasol  cream (TEMOVATE ) 0.05 % Apply 1 Application topically as directed. Qd up to 5 days a week to affected areas of eczema on body, avoid face, groin, axilla   Crisaborole  (EUCRISA ) 2 % OINT Apply 1 Application topically daily. qd to aa eczema on body as needed for flares   Dupilumab  (DUPIXENT ) 300 MG/2ML SOAJ Inject 300 mg into the skin every 14 (fourteen) days. Starting at day 15 for maintenance.   mometasone  (ELOCON ) 0.1 % cream Apply 1 Application topically 2 (two) times daily. Apply twice a day to affected areas as needed up to 5  days a week.   tacrolimus  (PROTOPIC ) 0.1 % ointment Apply topically daily. qd to aa eczema on face, body prn flares   [DISCONTINUED] escitalopram  (LEXAPRO ) 10 MG tablet Take 1 tablet (10 mg total) by mouth daily.   [DISCONTINUED] hydrOXYzine  (ATARAX ) 25 MG tablet Take 1/2 to 1 tablet po Bid prn itching   No facility-administered medications prior to visit.    ROS  Per HPI      Objective:     BP 106/70   Pulse (!) 44   Temp 97.6 F (36.4 C) (Oral)   Ht 5' 2 (1.575 m)   Wt 142 lb 1.9 oz (64.5 kg)   LMP 01/15/2024   SpO2 100%   BMI 25.99 kg/m    Physical Exam Constitutional:      General: She is not in acute distress.    Appearance: Normal appearance.  HENT:     Right Ear: Tympanic membrane normal.     Left Ear: Tympanic membrane normal.     Mouth/Throat:     Mouth: Mucous membranes are moist.     Pharynx: Oropharynx is clear.  Eyes:     Pupils: Pupils are equal, round, and reactive to light.  Cardiovascular:     Rate and Rhythm: Normal rate and regular rhythm.  Heart sounds: Normal heart sounds. No murmur heard.    No friction rub. No gallop.  Pulmonary:     Effort: Pulmonary effort is normal. No respiratory distress.     Breath sounds: Normal breath sounds.  Abdominal:     General: Abdomen is flat. Bowel sounds are normal.     Palpations: Abdomen is soft.  Musculoskeletal:        General: No swelling.     Cervical back: Normal range of motion.  Lymphadenopathy:     Cervical: No cervical adenopathy.  Skin:    General: Skin is warm and dry.  Neurological:     General: No focal deficit present.     Mental Status: She is alert.  Psychiatric:        Mood and Affect: Mood normal.        Behavior: Behavior normal.        Thought Content: Thought content normal.     No results found for any visits on 01/27/24. Last CBC Lab Results  Component Value Date   WBC 11.1 (H) 01/21/2024   HGB 14.0 01/21/2024   HCT 43.7 01/21/2024   MCV 93 01/21/2024   MCH  29.7 01/21/2024   RDW 12.8 01/21/2024   PLT 309 01/21/2024   Last metabolic panel Lab Results  Component Value Date   GLUCOSE 113 (H) 01/21/2024   NA 140 01/21/2024   K 3.9 01/21/2024   CL 102 01/21/2024   CO2 21 01/21/2024   BUN 11 01/21/2024   CREATININE 0.83 01/21/2024   EGFR 98 01/21/2024   CALCIUM 9.4 01/21/2024   PROT 8.0 01/21/2024   ALBUMIN 4.7 01/21/2024   LABGLOB 3.3 01/21/2024   AGRATIO 1.8 11/04/2022   BILITOT 0.7 01/21/2024   ALKPHOS 73 01/21/2024   AST 15 01/21/2024   ALT 10 01/21/2024   ANIONGAP 7 10/03/2020   Last lipids Lab Results  Component Value Date   CHOL 192 01/21/2024   HDL 34 (L) 01/21/2024   LDLCALC 136 (H) 01/21/2024   TRIG 118 01/21/2024   CHOLHDL 5.6 (H) 01/21/2024   Last hemoglobin A1c Lab Results  Component Value Date   HGBA1C 5.0 01/21/2024   Last thyroid  functions Lab Results  Component Value Date   TSH 2.090 01/21/2024        Assessment & Plan:    Routine Health Maintenance and Physical Exam  Immunization History  Administered Date(s) Administered   DTaP 03/12/1996, 09/30/1996, 10/16/2000   DTaP / IPV 10/07/1995, 12/22/1995   HIB (PRP-OMP) 10/07/1995, 12/22/1995, 03/12/1996, 08/16/1996   HPV Quadrivalent 06/28/2010, 08/31/2010, 12/28/2010   Hepatitis A, Ped/Adol-2 Dose 02/29/2008, 02/21/2010   Hepatitis B, PED/ADOLESCENT 05/01/96, 10/17/1995, 03/12/1996   IPV 12/22/1995, 03/12/1996, 10/16/2000   MMR 08/16/1996, 10/16/2000   Meningococcal Conjugate 02/21/2010, 02/15/2014   Tdap 02/29/2008, 08/03/2020, 07/03/2021   Varicella 09/30/1996, 02/29/2008    Health Maintenance  Topic Date Due   COVID-19 Vaccine (1) Never done   Pneumococcal Vaccine 72-37 Years old (1 of 2 - PCV) Never done   INFLUENZA VACCINE  02/06/2024   Cervical Cancer Screening (Pap smear)  07/03/2024   DTaP/Tdap/Td (9 - Td or Tdap) 07/04/2031   Hepatitis B Vaccines  Completed   HPV VACCINES  Completed   Hepatitis C Screening  Completed   HIV  Screening  Completed   Meningococcal B Vaccine  Aged Out    Discussed health benefits of physical activity, and encouraged her to engage in regular exercise appropriate for her age and condition.  Problem List Items Addressed This Visit       Other   Generalized anxiety disorder   Stable.  Patient stopped her Lexapro  10 mg daily because it upset her stomach.  She is not interested in starting any new daily medications for mood at this time.  Hydroxyzine  25 mg as needed for acute anxiety attacks.  Will continue to monitor.      Relevant Medications   traZODone  (DESYREL ) 50 MG tablet   hydrOXYzine  (ATARAX ) 25 MG tablet   Panic attack   Relevant Medications   traZODone  (DESYREL ) 50 MG tablet   hydrOXYzine  (ATARAX ) 25 MG tablet   Vitamin D  deficiency - Primary   Most recent vitamin D  within normal limits.  Will continue to monitor.      Hyperlipidemia   Last lipid panel: LDL 136, HDL 34, triglycerides 118. The ASCVD Risk score (Arnett DK, et al., 2019) failed to calculate for the following reasons:   The 2019 ASCVD risk score is only valid for ages 57 to 84 Patient is otherwise low risk and has lack of other risk factors including hypertension and diabetes, it is not indicated that we start a statin medication at this time.  Advised her to increase her daily physical activity through walking and work to reduce intake of high saturated/trans fat foods. Will cont to monitor lipid panel yearly.      General medical exam   Went over lab work in detail with the patient. Answered all patient questions. Went over and encouraged/ordered age-appropriate health screenings to include pap smear. Patient is up-to-date on all other screenings. No vaccines today. Encouraged patient to aim for 150+ minutes of physical activity per week or just increase their physical activity in general in combination with a well balanced diet that prioritizes protein and fiber to support a healthy lifestyle. Will plan  for next physical in 1 year, sooner PRN. Patient verbalized understanding and was in agreement with the plan.       Insomnia   Start trazodone  25 to 50 mg nightly as needed for insomnia.  Will continue to monitor.      Tobacco use   Patient currently smoking 1 pack/day and also vaping.  Counseled patient on the importance of cessation and advised her regarding cessation options including nicotine replacement therapy as well as prescription medications we can use to assist with cessation.  Patient verbalized that she is not interested in cessation at this time.  Will revisit cessation discussion at her next appointment.      Return in about 1 year (around 01/26/2025) for Physical.     Saddie JULIANNA Sacks, PA-C

## 2024-01-27 NOTE — Assessment & Plan Note (Signed)
 Patient currently smoking 1 pack/day and also vaping.  Counseled patient on the importance of cessation and advised her regarding cessation options including nicotine replacement therapy as well as prescription medications we can use to assist with cessation.  Patient verbalized that she is not interested in cessation at this time.  Will revisit cessation discussion at her next appointment.

## 2024-07-05 ENCOUNTER — Ambulatory Visit (INDEPENDENT_AMBULATORY_CARE_PROVIDER_SITE_OTHER)

## 2024-07-05 ENCOUNTER — Other Ambulatory Visit (HOSPITAL_COMMUNITY)
Admission: RE | Admit: 2024-07-05 | Discharge: 2024-07-05 | Disposition: A | Discharge status: Home / Self Care | Source: Ambulatory Visit

## 2024-07-05 VITALS — BP 114/73 | HR 60 | Temp 98.5°F | Ht 62.0 in | Wt 140.0 lb

## 2024-07-05 DIAGNOSIS — Z124 Encounter for screening for malignant neoplasm of cervix: Secondary | ICD-10-CM | POA: Insufficient documentation

## 2024-07-05 NOTE — Assessment & Plan Note (Signed)
 Pap smear performed today -- tolerated well. Will f/u with patient on results.

## 2024-07-05 NOTE — Progress Notes (Signed)
" ° °  Established Patient Office Visit  Subjective   Patient ID: Breanna Nguyen, female    DOB: 08/03/1995  Age: 28 y.o. MRN: [id number]  Chief Complaint  Patient presents with   Annual Exam    HPI  Discussed the use of AI scribe software for clinical note transcription with the patient, who gave verbal consent to proceed.  Breanna Nguyen is a 28 y.o. female who presents to the clinic for pap smear. She is without complaints today. Reports she is doing well. Declines vaccines today. Denies refills on any medications at this time.     ROS Per HPI.    Objective:     BP 114/73   Pulse 60   Temp 98.5 F (36.9 C) (Oral)   Ht 5' 2 (1.575 m)   Wt 140 lb 0.6 oz (63.5 kg)   LMP 06/13/2024   SpO2 98%   BMI 25.61 kg/m    Physical Exam Exam conducted with a chaperone present.  Constitutional:      General: She is not in acute distress.    Appearance: Normal appearance.  Cardiovascular:     Rate and Rhythm: Normal rate and regular rhythm.     Heart sounds: Normal heart sounds. No murmur heard.    No friction rub. No gallop.  Pulmonary:     Effort: Pulmonary effort is normal. No respiratory distress.     Breath sounds: Normal breath sounds.  Chest:  Breasts:    Breasts are symmetrical.     Right: Normal.     Left: Normal.  Genitourinary:    Exam position: Lithotomy position.     Vagina: Normal.     Cervix: Normal.     Adnexa: Right adnexa normal and left adnexa normal.  Musculoskeletal:        General: No swelling.  Lymphadenopathy:     Upper Body:     Right upper body: No supraclavicular or axillary adenopathy.     Left upper body: No supraclavicular or axillary adenopathy.  Skin:    General: Skin is warm and dry.  Neurological:     General: No focal deficit present.     Mental Status: She is alert.  Psychiatric:        Mood and Affect: Mood normal.        Behavior: Behavior normal.        Thought Content: Thought content normal.      No results found  for any visits on 07/05/24.    The ASCVD Risk score (Arnett DK, et al., 2019) failed to calculate for the following reasons:   The 2019 ASCVD risk score is only valid for ages 76 to 28   * - Cholesterol units were assumed    Assessment & Plan:   Cervical cancer screening -     Cytology - PAP  Encounter for Papanicolaou smear of cervix Assessment & Plan: Pap smear performed today -- tolerated well. Will f/u with patient on results.      Return if symptoms worsen or fail to improve.    Saddie JULIANNA Sacks, PA-C "

## 2024-07-09 ENCOUNTER — Ambulatory Visit: Payer: Self-pay

## 2024-07-09 LAB — CYTOLOGY - PAP
Adequacy: ABSENT
Chlamydia: NEGATIVE
Comment: NEGATIVE
Comment: NEGATIVE
Comment: NORMAL
Diagnosis: NEGATIVE
Neisseria Gonorrhea: NEGATIVE
Trichomonas: NEGATIVE

## 2024-07-22 ENCOUNTER — Ambulatory Visit: Admitting: Dermatology

## 2024-08-03 ENCOUNTER — Ambulatory Visit: Admitting: Dermatology

## 2024-08-30 ENCOUNTER — Ambulatory Visit: Admitting: Dermatology

## 2025-01-20 ENCOUNTER — Other Ambulatory Visit

## 2025-01-27 ENCOUNTER — Encounter
